# Patient Record
Sex: Female | Born: 1995 | Race: Black or African American | Hispanic: No | Marital: Single | Smoking: Current every day smoker
Health system: Southern US, Community
[De-identification: ages and names within clinical notes are randomized; demographics above are authoritative.]

## PROBLEM LIST (undated history)

## (undated) ENCOUNTER — Inpatient Hospital Stay (HOSPITAL_COMMUNITY): Payer: Self-pay

## (undated) DIAGNOSIS — F419 Anxiety disorder, unspecified: Secondary | ICD-10-CM

## (undated) DIAGNOSIS — R600 Localized edema: Secondary | ICD-10-CM

## (undated) DIAGNOSIS — L309 Dermatitis, unspecified: Secondary | ICD-10-CM

## (undated) DIAGNOSIS — N83209 Unspecified ovarian cyst, unspecified side: Secondary | ICD-10-CM

## (undated) DIAGNOSIS — M549 Dorsalgia, unspecified: Secondary | ICD-10-CM

## (undated) DIAGNOSIS — K589 Irritable bowel syndrome without diarrhea: Secondary | ICD-10-CM

## (undated) HISTORY — DX: Irritable bowel syndrome, unspecified: K58.9

## (undated) HISTORY — PX: NO PAST SURGERIES: SHX2092

## (undated) HISTORY — DX: Localized edema: R60.0

## (undated) HISTORY — DX: Anxiety disorder, unspecified: F41.9

## (undated) HISTORY — DX: Dorsalgia, unspecified: M54.9

---

## 2013-07-16 ENCOUNTER — Emergency Department (HOSPITAL_COMMUNITY): Payer: BC Managed Care – PPO

## 2013-07-16 ENCOUNTER — Emergency Department (HOSPITAL_COMMUNITY)
Admission: EM | Admit: 2013-07-16 | Discharge: 2013-07-16 | Disposition: A | Discharge status: Home / Self Care | Payer: BC Managed Care – PPO | Attending: Emergency Medicine | Admitting: Emergency Medicine

## 2013-07-16 ENCOUNTER — Encounter (HOSPITAL_COMMUNITY): Payer: Self-pay | Admitting: Emergency Medicine

## 2013-07-16 DIAGNOSIS — Z79899 Other long term (current) drug therapy: Secondary | ICD-10-CM | POA: Insufficient documentation

## 2013-07-16 DIAGNOSIS — F17200 Nicotine dependence, unspecified, uncomplicated: Secondary | ICD-10-CM | POA: Insufficient documentation

## 2013-07-16 DIAGNOSIS — N83209 Unspecified ovarian cyst, unspecified side: Secondary | ICD-10-CM | POA: Insufficient documentation

## 2013-07-16 DIAGNOSIS — Z3202 Encounter for pregnancy test, result negative: Secondary | ICD-10-CM | POA: Insufficient documentation

## 2013-07-16 LAB — CBC WITH DIFFERENTIAL/PLATELET
Basophils Absolute: 0 10*3/uL (ref 0.0–0.1)
Basophils Relative: 0 % (ref 0–1)
EOS ABS: 0.1 10*3/uL (ref 0.0–0.7)
EOS PCT: 1 % (ref 0–5)
HEMATOCRIT: 31.8 % — AB (ref 36.0–46.0)
Hemoglobin: 10.6 g/dL — ABNORMAL LOW (ref 12.0–15.0)
LYMPHS ABS: 2.8 10*3/uL (ref 0.7–4.0)
LYMPHS PCT: 25 % (ref 12–46)
MCH: 27.8 pg (ref 26.0–34.0)
MCHC: 33.3 g/dL (ref 30.0–36.0)
MCV: 83.5 fL (ref 78.0–100.0)
MONO ABS: 0.8 10*3/uL (ref 0.1–1.0)
Monocytes Relative: 7 % (ref 3–12)
Neutro Abs: 7.4 10*3/uL (ref 1.7–7.7)
Neutrophils Relative %: 67 % (ref 43–77)
PLATELETS: 312 10*3/uL (ref 150–400)
RBC: 3.81 MIL/uL — ABNORMAL LOW (ref 3.87–5.11)
RDW: 14.8 % (ref 11.5–15.5)
WBC: 11 10*3/uL — AB (ref 4.0–10.5)

## 2013-07-16 LAB — POCT PREGNANCY, URINE: Preg Test, Ur: NEGATIVE

## 2013-07-16 MED ORDER — SODIUM CHLORIDE 0.9 % IV BOLUS (SEPSIS)
1000.0000 mL | Freq: Once | INTRAVENOUS | Status: AC
  Administered 2013-07-16: 1000 mL via INTRAVENOUS

## 2013-07-16 NOTE — ED Provider Notes (Signed)
CSN: [id number]     Arrival date & time 07/16/13  0108 History   First MD Initiated Contact with Patient 07/16/13 0143     Chief Complaint  Patient presents with  . Vaginal Bleeding     (Consider location/radiation/quality/duration/timing/severity/associated sxs/prior Treatment) Patient is a 18 y.o. female presenting with vaginal bleeding. The history is provided by the patient.  Vaginal Bleeding Quality:  Bright red Severity:  Moderate Onset quality:  Sudden Duration:  1 day Timing:  Constant Progression:  Worsening Chronicity:  New Menstrual history:  Irregular Number of tampons used:  12 Possible pregnancy: yes   Context: at rest and spontaneously   Context: not after intercourse, not during intercourse and not foreign body   Relieved by:  None tried Worsened by:  Nothing tried Associated symptoms: no abdominal pain, no back pain, no dizziness, no dysuria, no fatigue, no nausea and no vaginal discharge   Risk factors: no bleeding disorder, no hx of endometriosis, no gynecological surgery and no ovarian cysts     History reviewed. No pertinent past medical history. History reviewed. No pertinent past surgical history. No family history on file. History  Substance Use Topics  . Smoking status: Current Every Day Smoker  . Smokeless tobacco: Not on file  . Alcohol Use: Yes   OB History   Grav Para Term Preterm Abortions TAB SAB Ect Mult Living                 Review of Systems  Constitutional: Negative for activity change and fatigue.  Respiratory: Negative for shortness of breath.   Cardiovascular: Negative for chest pain.  Gastrointestinal: Negative for nausea, vomiting and abdominal pain.  Genitourinary: Positive for vaginal bleeding. Negative for dysuria and vaginal discharge.  Musculoskeletal: Negative for back pain and neck pain.  Neurological: Negative for dizziness and headaches.  Hematological: Does not bruise/bleed easily.      Allergies  Review of  patient's allergies indicates no known allergies.  Home Medications   Current Outpatient Rx  Name  Route  Sig  Dispense  Refill  . BIOTIN PO   Oral   Take 1 tablet by mouth daily.         Marland Kitchen ibuprofen (ADVIL,MOTRIN) 200 MG tablet   Oral   Take 800 mg by mouth every 8 (eight) hours as needed for moderate pain.          BP 131/74  Pulse 79  Temp(Src) 97.8 F (36.6 C) (Oral)  Resp 18  Ht 5\' 1"  (1.549 m)  Wt 218 lb (98.884 kg)  BMI 41.21 kg/m2  SpO2 98%  LMP 06/11/2013 Physical Exam  Nursing note and vitals reviewed. Constitutional: She is oriented to person, place, and time. She appears well-developed and well-nourished.  HENT:  Head: Normocephalic and atraumatic.  Eyes: EOM are normal. Pupils are equal, round, and reactive to light.  Neck: Neck supple.  Cardiovascular: Normal rate, regular rhythm and normal heart sounds.   No murmur heard. Pulmonary/Chest: Effort normal. No respiratory distress.  Abdominal: Soft. She exhibits no distension. There is no tenderness. There is no rebound and no guarding.  Neurological: She is alert and oriented to person, place, and time.  Skin: Skin is warm and dry.    ED Course  Procedures (including critical care time) Labs Review Labs Reviewed  CBC WITH DIFFERENTIAL - Abnormal; Notable for the following:    WBC 11.0 (*)    RBC 3.81 (*)    Hemoglobin 10.6 (*)    HCT  31.8 (*)    All other components within normal limits  POCT PREGNANCY, URINE   Imaging Review [place] Transvaginal Non-ob  07/16/2013   CLINICAL DATA:  18 year old female with heavy vaginal bleeding. Passing clots. Initial encounter.  EXAM: TRANSABDOMINAL AND TRANSVAGINAL ULTRASOUND OF PELVIS  TECHNIQUE: Both transabdominal and transvaginal ultrasound examinations of the pelvis were performed. Transabdominal technique was performed for global imaging of the pelvis including uterus, ovaries, adnexal regions, and pelvic cul-de-sac. It was necessary to proceed with  endovaginal exam following the transabdominal exam to visualize the ovaries, endometrium.  COMPARISON:  None.  FINDINGS: Uterus  Measurements: 7.5 x 2.5 x 4.6 cm. No fibroids or other mass visualized.  Endometrium  Thickness: 5 mm.  No focal abnormality visualized.  Right ovary  Measurements: 4.8 x 2.3 x 3.3 cm. Multiple small follicles and what appears to be a collapsing cyst with some hemorrhage (image 43, 2.7 cm). No solid or vascular elements on Doppler interrogation (image 47).  Left ovary  Measurements: 2.6 x 1.0 x 2.5 cm. Small follicles. Normal appearance/no adnexal mass.  Other findings  Trace simple appearing free fluid.  IMPRESSION: Trace simple appearing free fluid and collapsing hemorrhagic cyst of the right ovary (2.7 cm). No followup indicated.  This recommendation follows the consensus statement: Management of Asymptomatic Ovarian and Other Adnexal Cysts Imaged at US: Society of Radiologists in Ultrasound Consensus Conference Statement. Radiology 2010; [phone].   Electronically Signed   By: Augusto GambleLee  Hall M.D.   On: 07/16/2013 04:07   [place] Pelvis Complete  07/16/2013   CLINICAL DATA:  18 year old female with heavy vaginal bleeding. Passing clots. Initial encounter.  EXAM: TRANSABDOMINAL AND TRANSVAGINAL ULTRASOUND OF PELVIS  TECHNIQUE: Both transabdominal and transvaginal ultrasound examinations of the pelvis were performed. Transabdominal technique was performed for global imaging of the pelvis including uterus, ovaries, adnexal regions, and pelvic cul-de-sac. It was necessary to proceed with endovaginal exam following the transabdominal exam to visualize the ovaries, endometrium.  COMPARISON:  None.  FINDINGS: Uterus  Measurements: 7.5 x 2.5 x 4.6 cm. No fibroids or other mass visualized.  Endometrium  Thickness: 5 mm.  No focal abnormality visualized.  Right ovary  Measurements: 4.8 x 2.3 x 3.3 cm. Multiple small follicles and what appears to be a collapsing cyst with some hemorrhage (image 43,  2.7 cm). No solid or vascular elements on Doppler interrogation (image 47).  Left ovary  Measurements: 2.6 x 1.0 x 2.5 cm. Small follicles. Normal appearance/no adnexal mass.  Other findings  Trace simple appearing free fluid.  IMPRESSION: Trace simple appearing free fluid and collapsing hemorrhagic cyst of the right ovary (2.7 cm). No followup indicated.  This recommendation follows the consensus statement: Management of Asymptomatic Ovarian and Other Adnexal Cysts Imaged at US: Society of Radiologists in Ultrasound Consensus Conference Statement. Radiology 2010; [phone] [id number].   Electronically Signed   By: Augusto GambleLee  Hall M.D.   On: 07/16/2013 04:07    EKG Interpretation   None       MDM   Final diagnoses:  Ovarian cyst  Hemorrhagic cyst of ovary    Pt  Comes in to the ER with cc of vaginal discharge. Pt seen by health dept, and got Gyne evaluation that was normal. She has been having bleeding all day today, with clots passage. Upreg is negative. No avd pain, no vaginald/c, no hx of same before. [place] pelvis shows hemorrhagic cyst. Will d.c with Gyne f.u - she has no private gynec.  Derwood KaplanAnkit Bocephus Cali, MD 07/16/13 [id number]

## 2013-07-16 NOTE — Discharge Instructions (Signed)
Your ultrasound shows bleeding ovarian cyst. Bleeding shoulder resolve on it's own, but if you don't improve - see the OB doctors as requested.   Ovarian Cyst An ovarian cyst is a fluid-filled sac that forms on an ovary. The ovaries are small organs that produce eggs in women. Various types of cysts can form on the ovaries. Most are not cancerous. Many do not cause problems, and they often go away on their own. Some may cause symptoms and require treatment. Common types of ovarian cysts include:  Functional cysts These cysts may occur every month during the menstrual cycle. This is normal. The cysts usually go away with the next menstrual cycle if the woman does not get pregnant. Usually, there are no symptoms with a functional cyst.  Endometrioma cysts These cysts form from the tissue that lines the uterus. They are also called "chocolate cysts" because they become filled with blood that turns brown. This type of cyst can cause pain in the lower abdomen during intercourse and with your menstrual period.  Cystadenoma cysts This type develops from the cells on the outside of the ovary. These cysts can get very big and cause lower abdomen pain and pain with intercourse. This type of cyst can twist on itself, cut off its blood supply, and cause severe pain. It can also easily rupture and cause a lot of pain.  Dermoid cysts This type of cyst is sometimes found in both ovaries. These cysts may contain different kinds of body tissue, such as skin, teeth, hair, or cartilage. They usually do not cause symptoms unless they get very big.  Theca lutein cysts These cysts occur when too much of a certain hormone (human chorionic gonadotropin) is produced and overstimulates the ovaries to produce an egg. This is most common after procedures used to assist with the conception of a baby (in vitro fertilization). CAUSES   Fertility drugs can cause a condition in which multiple large cysts are formed on the ovaries.  This is called ovarian hyperstimulation syndrome.  A condition called polycystic ovary syndrome can cause hormonal imbalances that can lead to nonfunctional ovarian cysts. SIGNS AND SYMPTOMS  Many ovarian cysts do not cause symptoms. If symptoms are present, they may include:  Pelvic pain or pressure.  Pain in the lower abdomen.  Pain during sexual intercourse.  Increasing girth (swelling) of the abdomen.  Abnormal menstrual periods.  Increasing pain with menstrual periods.  Stopping having menstrual periods without being pregnant. DIAGNOSIS  These cysts are commonly found during a routine or annual pelvic exam. Tests may be ordered to find out more about the cyst. These tests may include:  Ultrasound.  X-ray of the pelvis.  CT scan.  MRI.  Blood tests. TREATMENT  Many ovarian cysts go away on their own without treatment. Your health care provider may want to check your cyst regularly for 2 3 months to see if it changes. For women in menopause, it is particularly important to monitor a cyst closely because of the higher rate of ovarian cancer in menopausal women. When treatment is needed, it may include any of the following:  A procedure to drain the cyst (aspiration). This may be done using a long needle and ultrasound. It can also be done through a laparoscopic procedure. This involves using a thin, lighted tube with a tiny camera on the end (laparoscope) inserted through a small incision.  Surgery to remove the whole cyst. This may be done using laparoscopic surgery or an open surgery involving  a larger incision in the lower abdomen.  Hormone treatment or birth control pills. These methods are sometimes used to help dissolve a cyst. HOME CARE INSTRUCTIONS   Only take over-the-counter or prescription medicines as directed by your health care provider.  Follow up with your health care provider as directed.  Get regular pelvic exams and Pap tests. SEEK MEDICAL CARE IF:     Your periods are late, irregular, or painful, or they stop.  Your pelvic pain or abdominal pain does not go away.  Your abdomen becomes larger or swollen.  You have pressure on your bladder or trouble emptying your bladder completely.  You have pain during sexual intercourse.  You have feelings of fullness, pressure, or discomfort in your stomach.  You lose weight for no apparent reason.  You feel generally ill.  You become constipated.  You lose your appetite.  You develop acne.  You have an increase in body and facial hair.  You are gaining weight, without changing your exercise and eating habits.  You think you are pregnant. SEEK IMMEDIATE MEDICAL CARE IF:   You have increasing abdominal pain.  You feel sick to your stomach (nauseous), and you throw up (vomit).  You develop a fever that comes on suddenly.  You have abdominal pain during a bowel movement.  Your menstrual periods become heavier than usual. Document Released: 05/18/2005 Document Revised: 03/08/2013 Document Reviewed: 01/23/2013 [facility] Patient Information 2014 [place], [place].

## 2013-07-16 NOTE — ED Notes (Signed)
In dec she had this same type period.  No pain

## 2013-07-16 NOTE — ED Notes (Signed)
Heavy vaginal bleeding all day which is unusual for her.  lmp jan 11th .  This period started yesterday

## 2013-07-16 NOTE — ED Notes (Signed)
Patient states she has been vaginally bleeding heavily since yesterday.  States she has to change her tampon every 10-15 minutes.

## 2013-07-16 NOTE — ED Notes (Signed)
Continues to pass large clots.

## 2013-07-16 NOTE — ED Notes (Signed)
Patient returned from ultrasound.

## 2013-08-21 ENCOUNTER — Encounter: Payer: BC Managed Care – PPO | Admitting: Obstetrics and Gynecology

## 2013-08-31 ENCOUNTER — Inpatient Hospital Stay (HOSPITAL_COMMUNITY)
Admission: AD | Admit: 2013-08-31 | Discharge: 2013-08-31 | Disposition: A | Discharge status: Home / Self Care | Payer: BC Managed Care – PPO | Source: Ambulatory Visit | Attending: Obstetrics & Gynecology | Admitting: Obstetrics & Gynecology

## 2013-08-31 ENCOUNTER — Encounter (HOSPITAL_COMMUNITY): Payer: Self-pay | Admitting: *Deleted

## 2013-08-31 DIAGNOSIS — Z202 Contact with and (suspected) exposure to infections with a predominantly sexual mode of transmission: Secondary | ICD-10-CM

## 2013-08-31 DIAGNOSIS — L293 Anogenital pruritus, unspecified: Secondary | ICD-10-CM | POA: Insufficient documentation

## 2013-08-31 DIAGNOSIS — N898 Other specified noninflammatory disorders of vagina: Secondary | ICD-10-CM | POA: Insufficient documentation

## 2013-08-31 DIAGNOSIS — F17200 Nicotine dependence, unspecified, uncomplicated: Secondary | ICD-10-CM | POA: Insufficient documentation

## 2013-08-31 DIAGNOSIS — N83209 Unspecified ovarian cyst, unspecified side: Secondary | ICD-10-CM | POA: Insufficient documentation

## 2013-08-31 LAB — WET PREP, GENITAL
Clue Cells Wet Prep HPF POC: NONE SEEN
Trich, Wet Prep: NONE SEEN
WBC, Wet Prep HPF POC: NONE SEEN
Yeast Wet Prep HPF POC: NONE SEEN

## 2013-08-31 LAB — POCT PREGNANCY, URINE: Preg Test, Ur: NEGATIVE

## 2013-08-31 MED ORDER — IBUPROFEN 600 MG PO TABS: 600.0000 mg | ORAL_TABLET | Freq: Four times a day (QID) | ORAL | Status: DC | PRN

## 2013-08-31 MED ORDER — AZITHROMYCIN 250 MG PO TABS
1000.0000 mg | ORAL_TABLET | Freq: Once | ORAL | Status: AC
  Administered 2013-08-31: 1000 mg via ORAL
  Filled 2013-08-31: qty 4

## 2013-08-31 MED ORDER — CEFTRIAXONE SODIUM 250 MG IJ SOLR
250.0000 mg | Freq: Once | INTRAMUSCULAR | Status: AC
  Administered 2013-08-31: 250 mg via INTRAMUSCULAR
  Filled 2013-08-31: qty 250

## 2013-08-31 NOTE — MAU Note (Signed)
Pt states she has beening having milky white discharge and vaginal irritation, pt states she has also had unprotected sex and feels she might have an STD

## 2013-08-31 NOTE — MAU Note (Signed)
Symptoms like and STD. Has a white d/c.  Had it before, " knows her body"

## 2013-08-31 NOTE — MAU Provider Note (Signed)
History     CSN: [id number]  Arrival date and time: 08/31/13 1616   First Provider Initiated Contact with Patient 08/31/13 1741      Chief Complaint  Patient presents with  . Vaginal Discharge   Vaginal Discharge The patient's primary symptoms include a vaginal discharge. Pertinent negatives include no abdominal pain, chills, constipation, diarrhea, dysuria, fever, flank pain, frequency, headaches, hematuria, joint pain, nausea, rash, sore throat, urgency or vomiting.   Ms. Mackert is a 18 year old nulliparous female with a pmh of chlamydia s/p tx presenting for a 2 week history of progressively worsening vaginal discharge after having unprotected sex with a female. Patient reports that her symptoms are very similar to her previous STI and is concerned that she has chlamydia again. The discharge is without blood but she reports needing to change her underwear more frequently. She also admits mild itching but no rashes or lesions her vagina. Her lmp was 08/14/13 and was regular. She denies changes in urination, hematuria, abdominal and pelvic pain, fevers, N/V, diarrhea, constipation, joint pain.  Of note, in February she had an episode of heavy bleeding leading her to the ED. She was diagnosed with an ovarian cyst and has been scheduled for f/u on April 30th.   Past Medical History  Diagnosis Date  . Medical history non-contributory     Past Surgical History  Procedure Laterality Date  . No past surgeries      Family History  Problem Relation Age of Onset  . Diabetes Father   . Hypertension Father   . Kidney disease Father     History  Substance Use Topics  . Smoking status: Current Every Day Smoker  . Smokeless tobacco: Not on file  . Alcohol Use: Yes    Allergies: No Known Allergies  Prescriptions prior to admission  Medication Sig Dispense Refill  . BIOTIN PO Take 1 tablet by mouth daily.      Marland Kitchen ibuprofen (ADVIL,MOTRIN) 200 MG tablet Take 800 mg by mouth every 8  (eight) hours as needed for moderate pain.      Marland Kitchen triamcinolone cream (KENALOG) 0.1 % Apply 1 application topically 2 (two) times daily as needed (rash).        Review of Systems  Constitutional: Negative for fever and chills.  HENT: Negative for hearing loss and sore throat.   Eyes: Negative for blurred vision.  Respiratory: Negative for shortness of breath.   Cardiovascular: Negative for chest pain and leg swelling.  Gastrointestinal: Negative for nausea, vomiting, abdominal pain, diarrhea, constipation and blood in stool.  Genitourinary: Positive for vaginal discharge. Negative for dysuria, urgency, frequency, hematuria and flank pain.  Musculoskeletal: Negative for joint pain and myalgias.  Skin: Negative for itching and rash.  Neurological: Negative for dizziness and headaches.   Physical Exam   Blood pressure 127/56, pulse 94, temperature 98.4 F (36.9 C), temperature source Oral, resp. rate 18, height 5\' 1"  (1.549 m), weight 98.884 kg (218 lb), last menstrual period 08/14/2013.  Physical Exam  Constitutional: She is oriented to person, place, and time. She appears well-developed and well-nourished. No distress.  HENT:  Head: Normocephalic.  Eyes: EOM are normal. Pupils are equal, round, and reactive to light.  Neck: No tracheal deviation present.  Cardiovascular: Normal rate, regular rhythm, normal heart sounds and intact distal pulses.  Exam reveals no gallop and no friction rub.   No murmur heard. Respiratory: Effort normal and breath sounds normal. No stridor. No respiratory distress. She has no wheezes.  She has no rales. She exhibits no tenderness.  GI: Soft. Bowel sounds are normal. She exhibits no distension and no mass. There is no tenderness.  Genitourinary: There is breast tenderness (right sided adenexal tenderness) and discharge (thick white discharge). No breast swelling. There is no rash, tenderness, lesion or injury on the right labia. There is no rash, tenderness,  lesion or injury on the left labia. No tenderness or bleeding around the vagina. Vaginal discharge (thick white discharge) found.  Musculoskeletal: Normal range of motion. She exhibits no edema and no tenderness.  Neurological: She is alert and oriented to person, place, and time.  Skin: Skin is warm and dry. No rash noted. She is not diaphoretic.  Psychiatric: She has a normal mood and affect. Her behavior is normal. Thought content normal.   Results for orders placed during the hospital encounter of 08/31/13 (from the past 48 hour(s))  POCT PREGNANCY, URINE     Status: None   Collection Time    08/31/13  4:52 PM      Result Value Ref Range   Preg Test, Ur NEGATIVE  NEGATIVE   Comment:            THE SENSITIVITY OF THIS     METHODOLOGY IS >24 mIU/mL  WET PREP, GENITAL     Status: None   Collection Time    08/31/13  5:50 PM      Result Value Ref Range   Yeast Wet Prep HPF POC NONE SEEN  NONE SEEN   Trich, Wet Prep NONE SEEN  NONE SEEN   Clue Cells Wet Prep HPF POC NONE SEEN  NONE SEEN   WBC, Wet Prep HPF POC NONE SEEN  NONE SEEN   Comment: FEW BACTERIA SEEN    MAU Course  Procedures  MDM STI screen   Assessment and Plan  Ms. Lew DawesRitter is a 18 year old with possible STI. GC screen and wet prep obtained. Results pending. Patient treated empirically with Azithromycin and Ceftriaxone. Discussed birth control with patient as she endorsed not wanting to become pregnant. Also discussed importance of using protection to avoid STI's.  Wallis BambergMani, Mario 08/31/2013, 6:16 PM  Pt seen with Wallis BambergMario Mani, PA student and agree with assessment and management Pamelia HoitSusan Delyle Weider [facility]NP-BC

## 2013-09-01 LAB — GC/CHLAMYDIA PROBE AMP
CT Probe RNA: NEGATIVE
GC Probe RNA: NEGATIVE

## 2013-09-20 NOTE — MAU Provider Note (Signed)
Attestation of Attending Supervision of Advanced Practitioner (CNM/NP): Evaluation and management procedures were performed by the Advanced Practitioner under my supervision and collaboration.  I have reviewed the Advanced Practitioner's note and chart, and I agree with the management and plan.  Coston Mandato Harraway-Smith 1:55 PM

## 2013-09-28 ENCOUNTER — Ambulatory Visit (INDEPENDENT_AMBULATORY_CARE_PROVIDER_SITE_OTHER): Payer: BC Managed Care – PPO | Admitting: Obstetrics & Gynecology

## 2013-09-28 ENCOUNTER — Encounter: Payer: Self-pay | Admitting: Obstetrics & Gynecology

## 2013-09-28 ENCOUNTER — Telehealth: Payer: Self-pay | Admitting: *Deleted

## 2013-09-28 VITALS — BP 120/79 | HR 92 | Temp 98.1°F | Ht 61.0 in | Wt 214.8 lb

## 2013-09-28 DIAGNOSIS — Z113 Encounter for screening for infections with a predominantly sexual mode of transmission: Secondary | ICD-10-CM

## 2013-09-28 DIAGNOSIS — Z711 Person with feared health complaint in whom no diagnosis is made: Secondary | ICD-10-CM

## 2013-09-28 NOTE — Patient Instructions (Signed)
Sexually Transmitted Disease A sexually transmitted disease (STD) is a disease or infection that may be passed (transmitted) from person to person, usually during sexual activity. This may happen by way of saliva, semen, blood, vaginal mucus, or urine. Common STDs include:   Gonorrhea.   Chlamydia.   Syphilis.   HIV and AIDS.   Genital herpes.   Hepatitis B and C.   Trichomonas.   Human papillomavirus (HPV).   Pubic lice.   Scabies.  Mites.  Bacterial vaginosis. WHAT ARE CAUSES OF STDs? An STD may be caused by bacteria, a virus, or parasites. STDs are often transmitted during sexual activity if one person is infected. However, they may also be transmitted through nonsexual means. STDs may be transmitted after:   Sexual intercourse with an infected person.   Sharing sex toys with an infected person.   Sharing needles with an infected person or using unclean piercing or tattoo needles.  Having intimate contact with the genitals, mouth, or rectal areas of an infected person.   Exposure to infected fluids during birth. WHAT ARE THE SIGNS AND SYMPTOMS OF STDs? Different STDs have different symptoms. Some people may not have any symptoms. If symptoms are present, they may include:   Painful or bloody urination.   Pain in the pelvis, abdomen, vagina, anus, throat, or eyes.   Skin rash, itching, irritation, growths, sores (lesions), ulcerations, or warts in the genital or anal area.  Abnormal vaginal discharge with or without bad odor.   Penile discharge in men.   Fever.   Pain or bleeding during sexual intercourse.   Swollen glands in the groin area.   Yellow skin and eyes (jaundice). This is seen with hepatitis.   Swollen testicles.  Infertility.  Sores and blisters in the mouth. HOW ARE STDs DIAGNOSED? To make a diagnosis, your health care provider may:   Take a medical history.   Perform a physical exam.   Take a sample of any  discharge for examination.  Swab the throat, cervix, opening to the penis, rectum, or vagina for testing.  Test a sample of your first morning urine.   Perform blood tests.   Perform a Pap smear, if this applies.   Perform a colposcopy.   Perform a laparoscopy.  HOW ARE STDs TREATED? Treatment depends on the STD. Some STDs may be treated but not cured.   Chlamydia, gonorrhea, trichomonas, and syphilis can be cured with antibiotics.   Genital herpes, hepatitis, and HIV can be treated, but not cured, with prescribed medicines. The medicines lessen symptoms.   Genital warts from HPV can be treated with medicine or by freezing, burning (electrocautery), or surgery. Warts may come back.   HPV cannot be cured with medicine or surgery. However, abnormal areas may be removed from the cervix, vagina, or vulva.   If your diagnosis is confirmed, your recent sexual partners need treatment. This is true even if they are symptom-free or have a negative culture or evaluation. They should not have sex until their health care providers say it is OK. HOW CAN I REDUCE MY RISK OF GETTING AN STD?  Use latex condoms, dental dams, and water-soluble lubricants during sexual activity. Do not use petroleum jelly or oils.  Get vaccinated for HPV and hepatitis. If you have not received these vaccines in the past, talk to your health care provider about whether one or both might be right for you.   Avoid risky sex practices that can break the skin.  WHAT SHOULD   I DO IF I THINK I HAVE AN STD?  See your health care provider.   Inform all sexual partners. They should be tested and treated for any STDs.  Do not have sex until your health care provider says it is OK. WHEN SHOULD I GET HELP? Seek immediate medical care if:  You develop severe abdominal pain.  You are a man and notice swelling or pain in the testicles.  You are a woman and notice swelling or pain in your vagina. Document  Released: 08/08/2002 Document Revised: 03/08/2013 Document Reviewed: 12/06/2012 [employer] Patient Information 2014 [employer].  

## 2013-09-28 NOTE — Telephone Encounter (Signed)
Patient called requesting results of std tests. Informed patient that all results are negative. Patient had no further questions.

## 2013-09-28 NOTE — Progress Notes (Signed)
Patient ID: Rose Patterson, female   DOB: 1995-07-01, 18 y.o.   MRN: [id number]  Chief Complaint  Patient presents with  . Follow-up    HPI Rose Patterson is a 18 y.o. female.  G0P0 Patient's last menstrual period was 09/12/2013.  Was seen for heavy menses 07/2013 and hemorrhagic cyst was seen but o/w normal . Requests STD test today HPI  Past Medical History  Diagnosis Date  . Medical history non-contributory     Past Surgical History  Procedure Laterality Date  . No past surgeries      Family History  Problem Relation Age of Onset  . Diabetes Father   . Hypertension Father   . Kidney disease Father     Social History History  Substance Use Topics  . Smoking status: Current Every Day Smoker -- 0.50 packs/day    Types: Cigarettes  . Smokeless tobacco: Not on file  . Alcohol Use: 1.2 oz/week    1 Cans of beer, 1 Shots of liquor per week    No Known Allergies  Current Outpatient Prescriptions  Medication Sig Dispense Refill  . BIOTIN PO Take 1 tablet by mouth daily.      Marland Kitchen. ibuprofen (ADVIL,MOTRIN) 200 MG tablet Take 800 mg by mouth every 8 (eight) hours as needed for moderate pain.      Marland Kitchen. ibuprofen (ADVIL,MOTRIN) 600 MG tablet Take 1 tablet (600 mg total) by mouth every 6 (six) hours as needed.  30 tablet  0  . triamcinolone cream (KENALOG) 0.1 % Apply 1 application topically 2 (two) times daily as needed (rash).       No current facility-administered medications for this visit.    Review of Systems Review of Systems  Constitutional: Negative.   Genitourinary: Negative for vaginal bleeding, vaginal discharge, menstrual problem and pelvic pain.    Blood pressure 120/79, pulse 92, temperature 98.1 F (36.7 C), height 5\' 1"  (1.549 m), weight 214 lb 12.8 oz (97.433 kg), last menstrual period 09/12/2013.  Physical Exam Physical Exam  Constitutional: She appears well-developed. No distress.  Genitourinary: Vagina normal and uterus normal. No vaginal  discharge found.  No mass, STD probe done  Musculoskeletal: Normal range of motion.  Skin: Skin is warm and dry.  Psychiatric: She has a normal mood and affect. Her behavior is normal.    Data Reviewed CLINICAL DATA: 18 year old female with heavy vaginal bleeding.  Passing clots. Initial encounter.  EXAM:  TRANSABDOMINAL AND TRANSVAGINAL ULTRASOUND OF PELVIS  TECHNIQUE:  Both transabdominal and transvaginal ultrasound examinations of the  pelvis were performed. Transabdominal technique was performed for  global imaging of the pelvis including uterus, ovaries, adnexal  regions, and pelvic cul-de-sac. It was necessary to proceed with  endovaginal exam following the transabdominal exam to visualize the  ovaries, endometrium.  COMPARISON: None.  FINDINGS:  Uterus  Measurements: 7.5 x 2.5 x 4.6 cm. No fibroids or other mass  visualized.  Endometrium  Thickness: 5 mm. No focal abnormality visualized.  Right ovary  Measurements: 4.8 x 2.3 x 3.3 cm. Multiple small follicles and what  appears to be a collapsing cyst with some hemorrhage (image 43, 2.7  cm). No solid or vascular elements on Doppler interrogation (image  47).  Left ovary  Measurements: 2.6 x 1.0 x 2.5 cm. Small follicles. Normal  appearance/no adnexal mass.  Other findings  Trace simple appearing free fluid.  IMPRESSION:  Trace simple appearing free fluid and collapsing hemorrhagic cyst of  the right ovary (2.7 cm). No  followup indicated.  This recommendation follows the consensus statement: Management of  Asymptomatic Ovarian and Other Adnexal Cysts Imaged at US: Society  of Radiologists in Ultrasound Consensus Conference Statement.  Radiology 2010; [phone].  Electronically Signed  By: Augusto GambleLee Hall M.D.  On: 07/16/2013 04:07    Assessment    S/p hemorrhagic cyst, concern for STD     Plan    F/u STD test. Condoms and spermacide recommended.        Adam PhenixJames G Natayah Warmack 09/28/2013, 4:18 PM

## 2013-09-29 LAB — GC/CHLAMYDIA PROBE AMP
CT PROBE, AMP APTIMA: NEGATIVE
GC PROBE AMP APTIMA: NEGATIVE

## 2013-10-02 ENCOUNTER — Telehealth (HOSPITAL_COMMUNITY): Payer: Self-pay

## 2013-10-02 NOTE — Telephone Encounter (Signed)
Called patient to inform her that her GC/CT test results were negative.

## 2013-11-15 ENCOUNTER — Encounter: Payer: Self-pay | Admitting: *Deleted

## 2016-11-24 ENCOUNTER — Inpatient Hospital Stay (HOSPITAL_COMMUNITY): Payer: Medicaid Other

## 2016-11-24 ENCOUNTER — Encounter (HOSPITAL_COMMUNITY): Payer: Self-pay | Admitting: Student

## 2016-11-24 ENCOUNTER — Inpatient Hospital Stay (HOSPITAL_COMMUNITY)
Admission: AD | Admit: 2016-11-24 | Discharge: 2016-11-24 | Disposition: A | Discharge status: Home / Self Care | Payer: Medicaid Other | Source: Ambulatory Visit | Attending: Family Medicine | Admitting: Family Medicine

## 2016-11-24 DIAGNOSIS — Z825 Family history of asthma and other chronic lower respiratory diseases: Secondary | ICD-10-CM | POA: Diagnosis not present

## 2016-11-24 DIAGNOSIS — Z8249 Family history of ischemic heart disease and other diseases of the circulatory system: Secondary | ICD-10-CM | POA: Insufficient documentation

## 2016-11-24 DIAGNOSIS — O99331 Smoking (tobacco) complicating pregnancy, first trimester: Secondary | ICD-10-CM | POA: Diagnosis not present

## 2016-11-24 DIAGNOSIS — O4690 Antepartum hemorrhage, unspecified, unspecified trimester: Secondary | ICD-10-CM

## 2016-11-24 DIAGNOSIS — Z841 Family history of disorders of kidney and ureter: Secondary | ICD-10-CM | POA: Diagnosis not present

## 2016-11-24 DIAGNOSIS — O208 Other hemorrhage in early pregnancy: Secondary | ICD-10-CM | POA: Insufficient documentation

## 2016-11-24 DIAGNOSIS — F17210 Nicotine dependence, cigarettes, uncomplicated: Secondary | ICD-10-CM | POA: Insufficient documentation

## 2016-11-24 DIAGNOSIS — N939 Abnormal uterine and vaginal bleeding, unspecified: Secondary | ICD-10-CM | POA: Diagnosis present

## 2016-11-24 DIAGNOSIS — O468X1 Other antepartum hemorrhage, first trimester: Secondary | ICD-10-CM

## 2016-11-24 DIAGNOSIS — Z3401 Encounter for supervision of normal first pregnancy, first trimester: Secondary | ICD-10-CM

## 2016-11-24 DIAGNOSIS — O418X10 Other specified disorders of amniotic fluid and membranes, first trimester, not applicable or unspecified: Secondary | ICD-10-CM

## 2016-11-24 DIAGNOSIS — Z3A12 12 weeks gestation of pregnancy: Secondary | ICD-10-CM | POA: Insufficient documentation

## 2016-11-24 DIAGNOSIS — Z833 Family history of diabetes mellitus: Secondary | ICD-10-CM | POA: Diagnosis not present

## 2016-11-24 HISTORY — DX: Unspecified ovarian cyst, unspecified side: N83.209

## 2016-11-24 HISTORY — DX: Dermatitis, unspecified: L30.9

## 2016-11-24 LAB — CBC
HEMATOCRIT: 34.3 % — AB (ref 36.0–46.0)
HEMOGLOBIN: 11.4 g/dL — AB (ref 12.0–15.0)
MCH: 28.1 pg (ref 26.0–34.0)
MCHC: 33.2 g/dL (ref 30.0–36.0)
MCV: 84.5 fL (ref 78.0–100.0)
Platelets: 270 10*3/uL (ref 150–400)
RBC: 4.06 MIL/uL (ref 3.87–5.11)
RDW: 14.1 % (ref 11.5–15.5)
WBC: 10.7 10*3/uL — ABNORMAL HIGH (ref 4.0–10.5)

## 2016-11-24 LAB — URINALYSIS, MICROSCOPIC (REFLEX)

## 2016-11-24 LAB — HCG, QUANTITATIVE, PREGNANCY: hCG, Beta Chain, Quant, S: 61892 m[IU]/mL — ABNORMAL HIGH (ref <5)

## 2016-11-24 LAB — URINALYSIS, ROUTINE W REFLEX MICROSCOPIC
BILIRUBIN URINE: NEGATIVE
Glucose, UA: NEGATIVE mg/dL
Ketones, ur: NEGATIVE mg/dL
Leukocytes, UA: NEGATIVE
Nitrite: NEGATIVE
Protein, ur: NEGATIVE mg/dL
Specific Gravity, Urine: 1.02 (ref 1.005–1.030)
pH: 6 (ref 5.0–8.0)

## 2016-11-24 LAB — WET PREP, GENITAL
Clue Cells Wet Prep HPF POC: NONE SEEN
SPERM: NONE SEEN
Trich, Wet Prep: NONE SEEN
YEAST WET PREP: NONE SEEN

## 2016-11-24 LAB — ABO/RH: ABO/RH(D): O POS

## 2016-11-24 LAB — POCT PREGNANCY, URINE: Preg Test, Ur: POSITIVE — AB

## 2016-11-24 NOTE — MAU Provider Note (Signed)
History    CSN: [id number]  Arrival date and time: 11/24/16 4098   First Provider Initiated Contact with Patient 11/24/16 0957      Chief Complaint  Patient presents with  . Vaginal Bleeding   HPI Rose Patterson is a 21 y.o. G1P0 at [redacted]w[redacted]d who presents complaining of vaginal bleeding that started this morning. She reports a large gush of bright red bleeding this morning when she got up. It has slowed down since. She denies any pain. She reports some brown discharge since she found out she was pregnant. LMP 08/30/16. She denies any recent intercourse. She has not tried any treatments, nothing makes the bleeding better or worse. There are no associated symptoms.  OB History    Gravida Para Term Preterm AB Living   1             SAB TAB Ectopic Multiple Live Births                  Past Medical History:  Diagnosis Date  . Eczema   . Medical history non-contributory   . Ovarian cyst     Past Surgical History:  Procedure Laterality Date  . NO PAST SURGERIES      Family History  Problem Relation Age of Onset  . Diabetes Father   . Hypertension Father   . Kidney disease Father   . Asthma Father   . Cancer Paternal Grandmother        colon    Social History  Substance Use Topics  . Smoking status: Current Every Day Smoker    Packs/day: 0.25    Years: 5.00    Types: Cigarettes  . Smokeless tobacco: Never Used     Comment: was a pk a day  . Alcohol use No    Allergies: No Known Allergies  No prescriptions prior to admission.    Review of Systems  Constitutional: Negative.   Respiratory: Negative.  Negative for shortness of breath.   Cardiovascular: Negative for chest pain.  Gastrointestinal: Negative.  Negative for abdominal pain.  Genitourinary: Positive for vaginal bleeding and vaginal discharge. Negative for dysuria, frequency and urgency.  Neurological: Negative.  Negative for light-headedness and headaches.   Physical Exam   Blood pressure (!)  112/53, pulse 85, temperature 98.7 F (37.1 C), temperature source Oral, resp. rate 18, height 5\' 1"  (1.549 m), weight 187 lb (84.8 kg), last menstrual period 08/30/2016.  Physical Exam  Nursing note and vitals reviewed. Constitutional: She is oriented to person, place, and time. She appears well-developed and well-nourished.  HENT:  Head: Normocephalic and atraumatic.  Eyes: Conjunctivae are normal. No scleral icterus.  Cardiovascular: Normal rate, regular rhythm and normal heart sounds.   Respiratory: Effort normal and breath sounds normal. No respiratory distress.  GI: Soft. She exhibits no distension. There is no tenderness.  Genitourinary: There is bleeding in the vagina.  Genitourinary Comments: Small amount of dark red blood around cervix with one small dark clot. Os appears to be open  Neurological: She is alert and oriented to person, place, and time.  Skin: Skin is warm and dry.  Psychiatric: She has a normal mood and affect. Her behavior is normal. Judgment and thought content normal.    MAU Course  Procedures Results for orders placed or performed during the hospital encounter of 11/24/16 (from the past 24 hour(s))  Urinalysis, Routine w reflex microscopic     Status: Abnormal   Collection Time: 11/24/16  9:40 AM  Result  Value Ref Range   Color, Urine YELLOW YELLOW   APPearance CLEAR CLEAR   Specific Gravity, Urine 1.020 1.005 - 1.030   pH 6.0 5.0 - 8.0   Glucose, UA NEGATIVE NEGATIVE mg/dL   Hgb urine dipstick LARGE (A) NEGATIVE   Bilirubin Urine NEGATIVE NEGATIVE   Ketones, ur NEGATIVE NEGATIVE mg/dL   Protein, ur NEGATIVE NEGATIVE mg/dL   Nitrite NEGATIVE NEGATIVE   Leukocytes, UA NEGATIVE NEGATIVE  Urinalysis, Microscopic (reflex)     Status: Abnormal   Collection Time: 11/24/16  9:40 AM  Result Value Ref Range   RBC / HPF 6-30 0 - 5 RBC/hpf   WBC, UA 0-5 0 - 5 WBC/hpf   Bacteria, UA RARE (A) NONE SEEN   Squamous Epithelial / LPF 0-5 (A) NONE SEEN   Pregnancy, urine POC     Status: Abnormal   Collection Time: 11/24/16  9:56 AM  Result Value Ref Range   Preg Test, Ur POSITIVE (A) NEGATIVE  Wet prep, genital     Status: Abnormal   Collection Time: 11/24/16 10:09 AM  Result Value Ref Range   Yeast Wet Prep HPF POC NONE SEEN NONE SEEN   Trich, Wet Prep NONE SEEN NONE SEEN   Clue Cells Wet Prep HPF POC NONE SEEN NONE SEEN   WBC, Wet Prep HPF POC FEW (A) NONE SEEN   Sperm NONE SEEN   CBC     Status: Abnormal   Collection Time: 11/24/16 10:45 AM  Result Value Ref Range   WBC 10.7 (H) 4.0 - 10.5 K/uL   RBC 4.06 3.87 - 5.11 MIL/uL   Hemoglobin 11.4 (L) 12.0 - 15.0 g/dL   HCT 16.1 (L) 09.6 - 04.5 %   MCV 84.5 78.0 - 100.0 fL   MCH 28.1 26.0 - 34.0 pg   MCHC 33.2 30.0 - 36.0 g/dL   RDW 40.9 81.1 - 91.4 %   Platelets 270 150 - 400 K/uL  ABO/Rh     Status: None (Preliminary result)   Collection Time: 11/24/16 10:46 AM  Result Value Ref Range   ABO/RH(D) O POS    US Ob Comp Less 14 Wks  Result Date: 11/24/2016 CLINICAL DATA:  Vaginal bleeding EXAM: OBSTETRIC <14 WK ULTRASOUND TECHNIQUE: Transabdominal ultrasound was performed for evaluation of the gestation as well as the maternal uterus and adnexal regions. COMPARISON:  None. FINDINGS: Intrauterine gestational sac: Single Yolk sac:  Visualized Embryo:  Visualized Cardiac Activity: Visualize Heart Rate: 159 bpm MSD:   mm    w     d CRL:   53.6  mm   12 w 0 d                  [place] EDC: 06/08/2017 Subchorionic hemorrhage:  Small subchorionic hemorrhage Maternal uterus/adnexae: No adnexal mass or free fluid. IMPRESSION: Twelve week pregnancy with fetal heart rate 159 beats per minute. Small subchorionic hemorrhage. Electronically Signed   By: Charlett Nose M.D.   On: 11/24/2016 10:57    MDM UA, UPT CBC, quant, ABO/Rh, HIV antibody Wet prep and gc/chlamydia US OB transvaginal US OB Comp Less 14 wks US shows a 12 week IUP with a heart rate of 159bpm and a small subchorionic hemorrhage   Assessment and Plan   1. Vaginal bleeding in pregnancy   2. Encounter for supervision of normal first pregnancy in first trimester   3. Subchorionic hemorrhage of placenta in first trimester, single or unspecified fetus    -Discharge patient home in  stable condition -Discussed subchorionic hemorrhage with patient and bleeding precautions reviewed. -Encouraged patient to keep appointment to start prenatal care.. -Encouraged to return here or to other Urgent Care/ED if she develops worsening of symptoms, increase in pain, fever, or other concerning symptoms.   Cleone SlimCaroline Neill SNM 11/24/2016, 11:33 AM   Follow-up Information    [facility].   Contact information: [address]  [address] [place] [address] [phone]          I have seen this patient, performed the physical exam, and agree with the above nurse-midwife's note.  LEFTWICH-KIRBY, Macklyn Glandon Certified Nurse-Midwife

## 2016-11-24 NOTE — MAU Note (Signed)
Pt believes she is 12wks preg.  Started bleeding this morning.  Denies any pain

## 2016-11-24 NOTE — Discharge Instructions (Signed)
First Trimester of Pregnancy °The first trimester of pregnancy is from week 1 until the end of week 13 (months 1 through 3). A week after a sperm fertilizes an egg, the egg will implant on the wall of the uterus. This embryo will begin to develop into a baby. Genes from you and your partner will form the baby. The female genes will determine whether the baby will be a boy or a girl. At 6-8 weeks, the eyes and face will be formed, and the heartbeat can be seen on ultrasound. At the end of 12 weeks, all the baby's organs will be formed. °Now that you are pregnant, you will want to do everything you can to have a healthy baby. Two of the most important things are to get good prenatal care and to follow your health care provider's instructions. Prenatal care is all the medical care you receive before the baby's birth. This care will help prevent, find, and treat any problems during the pregnancy and childbirth. °Body changes during your first trimester °Your body goes through many changes during pregnancy. The changes vary from woman to woman. °· You may gain or lose a couple of pounds at first. °· You may feel sick to your stomach (nauseous) and you may throw up (vomit). If the vomiting is uncontrollable, call your health care provider. °· You may tire easily. °· You may develop headaches that can be relieved by medicines. All medicines should be approved by your health care provider. °· You may urinate more often. Painful urination may mean you have a bladder infection. °· You may develop heartburn as a result of your pregnancy. °· You may develop constipation because certain hormones are causing the muscles that push stool through your intestines to slow down. °· You may develop hemorrhoids or swollen veins (varicose veins). °· Your breasts may begin to grow larger and become tender. Your nipples may stick out more, and the tissue that surrounds them (areola) may become darker. °· Your gums may bleed and may be  sensitive to brushing and flossing. °· Dark spots or blotches (chloasma, mask of pregnancy) may develop on your face. This will likely fade after the baby is born. °· Your menstrual periods will stop. °· You may have a loss of appetite. °· You may develop cravings for certain kinds of food. °· You may have changes in your emotions from day to day, such as being excited to be pregnant or being concerned that something may go wrong with the pregnancy and baby. °· You may have more vivid and strange dreams. °· You may have changes in your hair. These can include thickening of your hair, rapid growth, and changes in texture. Some women also have hair loss during or after pregnancy, or hair that feels dry or thin. Your hair will most likely return to normal after your baby is born. ° °What to expect at prenatal visits °During a routine prenatal visit: °· You will be weighed to make sure you and the baby are growing normally. °· Your blood pressure will be taken. °· Your abdomen will be measured to track your baby's growth. °· The fetal heartbeat will be listened to between weeks 10 and 14 of your pregnancy. °· Test results from any previous visits will be discussed. ° °Your health care provider may ask you: °· How you are feeling. °· If you are feeling the baby move. °· If you have had any abnormal symptoms, such as leaking fluid, bleeding, severe headaches,   or abdominal cramping. °· If you are using any tobacco products, including cigarettes, chewing tobacco, and electronic cigarettes. °· If you have any questions. ° °Other tests that may be performed during your first trimester include: °· Blood tests to find your blood type and to check for the presence of any previous infections. The tests will also be used to check for low iron levels (anemia) and protein on red blood cells (Rh antibodies). Depending on your risk factors, or if you previously had diabetes during pregnancy, you may have tests to check for high blood  sugar that affects pregnant women (gestational diabetes). °· Urine tests to check for infections, diabetes, or protein in the urine. °· An ultrasound to confirm the proper growth and development of the baby. °· Fetal screens for spinal cord problems (spina bifida) and Down syndrome. °· HIV (human immunodeficiency virus) testing. Routine prenatal testing includes screening for HIV, unless you choose not to have this test. °· You may need other tests to make sure you and the baby are doing well. ° °Follow these instructions at home: °Medicines °· Follow your health care provider's instructions regarding medicine use. Specific medicines may be either safe or unsafe to take during pregnancy. °· Take a prenatal vitamin that contains at least 600 micrograms (mcg) of folic acid. °· If you develop constipation, try taking a stool softener if your health care provider approves. °Eating and drinking °· Eat a balanced diet that includes fresh fruits and vegetables, whole grains, good sources of protein such as meat, eggs, or tofu, and low-fat dairy. Your health care provider will help you determine the amount of weight gain that is right for you. °· Avoid raw meat and uncooked cheese. These carry germs that can cause birth defects in the baby. °· Eating four or five small meals rather than three large meals a day may help relieve nausea and vomiting. If you start to feel nauseous, eating a few soda crackers can be helpful. Drinking liquids between meals, instead of during meals, also seems to help ease nausea and vomiting. °· Limit foods that are high in fat and processed sugars, such as fried and sweet foods. °· To prevent constipation: °? Eat foods that are high in fiber, such as fresh fruits and vegetables, whole grains, and beans. °? Drink enough fluid to keep your urine clear or pale yellow. °Activity °· Exercise only as directed by your health care provider. Most women can continue their usual exercise routine during  pregnancy. Try to exercise for 30 minutes at least 5 days a week. Exercising will help you: °? Control your weight. °? Stay in shape. °? Be prepared for labor and delivery. °· Experiencing pain or cramping in the lower abdomen or lower back is a good sign that you should stop exercising. Check with your health care provider before continuing with normal exercises. °· Try to avoid standing for long periods of time. Move your legs often if you must stand in one place for a long time. °· Avoid heavy lifting. °· Wear low-heeled shoes and practice good posture. °· You may continue to have sex unless your health care provider tells you not to. °Relieving pain and discomfort °· Wear a good support bra to relieve breast tenderness. °· Take warm sitz baths to soothe any pain or discomfort caused by hemorrhoids. Use hemorrhoid cream if your health care provider approves. °· Rest with your legs elevated if you have leg cramps or low back pain. °· If you develop   varicose veins in your legs, wear support hose. Elevate your feet for 15 minutes, 3-4 times a day. Limit salt in your diet. °Prenatal care °· Schedule your prenatal visits by the twelfth week of pregnancy. They are usually scheduled monthly at first, then more often in the last 2 months before delivery. °· Write down your questions. Take them to your prenatal visits. °· Keep all your prenatal visits as told by your health care provider. This is important. °Safety °· Wear your seat belt at all times when driving. °· Make a list of emergency phone numbers, including numbers for family, friends, the hospital, and police and fire departments. °General instructions °· Ask your health care provider for a referral to a local prenatal education class. Begin classes no later than the beginning of month 6 of your pregnancy. °· Ask for help if you have counseling or nutritional needs during pregnancy. Your health care provider can offer advice or refer you to specialists for help  with various needs. °· Do not use hot tubs, steam rooms, or saunas. °· Do not douche or use tampons or scented sanitary pads. °· Do not cross your legs for long periods of time. °· Avoid cat litter boxes and soil used by cats. These carry germs that can cause birth defects in the baby and possibly loss of the fetus by miscarriage or stillbirth. °· Avoid all smoking, herbs, alcohol, and medicines not prescribed by your health care provider. Chemicals in these products affect the formation and growth of the baby. °· Do not use any products that contain nicotine or tobacco, such as cigarettes and e-cigarettes. If you need help quitting, ask your health care provider. You may receive counseling support and other resources to help you quit. °· Schedule a dentist appointment. At home, brush your teeth with a soft toothbrush and be gentle when you floss. °Contact a health care provider if: °· You have dizziness. °· You have mild pelvic cramps, pelvic pressure, or nagging pain in the abdominal area. °· You have persistent nausea, vomiting, or diarrhea. °· You have a bad smelling vaginal discharge. °· You have pain when you urinate. °· You notice increased swelling in your face, hands, legs, or ankles. °· You are exposed to fifth disease or chickenpox. °· You are exposed to German measles (rubella) and have never had it. °Get help right away if: °· You have a fever. °· You are leaking fluid from your vagina. °· You have spotting or bleeding from your vagina. °· You have severe abdominal cramping or pain. °· You have rapid weight gain or loss. °· You vomit blood or material that looks like coffee grounds. °· You develop a severe headache. °· You have shortness of breath. °· You have any kind of trauma, such as from a fall or a car accident. °Summary °· The first trimester of pregnancy is from week 1 until the end of week 13 (months 1 through 3). °· Your body goes through many changes during pregnancy. The changes vary from  woman to woman. °· You will have routine prenatal visits. During those visits, your health care provider will examine you, discuss any test results you may have, and talk with you about how you are feeling. °This information is not intended to replace advice given to you by your health care provider. Make sure you discuss any questions you have with your health care provider. °Document Released: 05/12/2001 Document Revised: 04/29/2016 Document Reviewed: 04/29/2016 °Elsevier Interactive Patient Education © 2017 [employer]   Inc[employer] °Subchorionic Hematoma °A subchorionic hematoma is a gathering of blood between the outer wall of the placenta and the inner wall of the womb (uterus). The placenta is the organ that connects the fetus to the wall of the uterus. The placenta performs the feeding, breathing (oxygen to the fetus), and waste removal (excretory work) of the fetus. °Subchorionic hematoma is the most common abnormality found on a result from ultrasonography done during the first trimester or early second trimester of pregnancy. If there has been little or no vaginal bleeding, early small hematomas usually shrink on their own and do not affect your baby or pregnancy. The blood is gradually absorbed over 1-2 weeks. When bleeding starts later in pregnancy or the hematoma is larger or occurs in an older pregnant woman, the outcome may not be as good. Larger hematomas may get bigger, which increases the chances for miscarriage. Subchorionic hematoma also increases the risk of premature detachment of the placenta from the uterus, preterm (premature) labor, and stillbirth. °Follow these instructions at home: °· Stay on bed rest if your health care provider recommends this. Although bed rest will not prevent more bleeding or prevent a miscarriage, your health care provider may recommend bed rest until you are advised otherwise. °· Avoid heavy lifting (more than 10 lb [4.5 kg]), exercise, sexual intercourse, or douching as  directed by your health care provider. °· Keep track of the number of pads you use each day and how soaked (saturated) they are. Write down this information. °· Do not use tampons. °· Keep all follow-up appointments as directed by your health care provider. Your health care provider may ask you to have follow-up blood tests or ultrasound tests or both. °Get help right away if: °· You have severe cramps in your stomach, back, abdomen, or pelvis. °· You have a fever. °· You pass large clots or tissue. Save any tissue for your health care provider to look at. °· Your bleeding increases or you become lightheaded, feel weak, or have fainting episodes. °This information is not intended to replace advice given to you by your health care provider. Make sure you discuss any questions you have with your health care provider. °Document Released: 09/02/2006 Document Revised: 10/24/2015 Document Reviewed: 12/15/2012 °Elsevier Interactive Patient Education © 2017 [employer] Inc. ° °

## 2016-11-24 NOTE — MAU Note (Signed)
preg confirmed at health Dept in May.  Has appt for this Thur.  When getting ready for work, went to the restroom, bleeding became more like a flow at that time. Denies pain

## 2016-11-25 LAB — GC/CHLAMYDIA PROBE AMP (~~LOC~~) NOT AT ARMC
CHLAMYDIA, DNA PROBE: NEGATIVE
NEISSERIA GONORRHEA: NEGATIVE

## 2016-11-25 LAB — HIV ANTIBODY (ROUTINE TESTING W REFLEX): HIV Screen 4th Generation wRfx: NONREACTIVE

## 2016-11-26 DIAGNOSIS — Z720 Tobacco use: Secondary | ICD-10-CM | POA: Insufficient documentation

## 2017-03-27 ENCOUNTER — Inpatient Hospital Stay (HOSPITAL_COMMUNITY)
Admission: AD | Admit: 2017-03-27 | Discharge: 2017-03-27 | Disposition: A | Discharge status: Home / Self Care | Payer: Medicaid Other | Source: Ambulatory Visit | Attending: Obstetrics and Gynecology | Admitting: Obstetrics and Gynecology

## 2017-03-27 ENCOUNTER — Encounter (HOSPITAL_COMMUNITY): Payer: Self-pay | Admitting: Student

## 2017-03-27 DIAGNOSIS — Z3A29 29 weeks gestation of pregnancy: Secondary | ICD-10-CM | POA: Insufficient documentation

## 2017-03-27 DIAGNOSIS — O9A213 Injury, poisoning and certain other consequences of external causes complicating pregnancy, third trimester: Secondary | ICD-10-CM | POA: Diagnosis not present

## 2017-03-27 DIAGNOSIS — O99333 Smoking (tobacco) complicating pregnancy, third trimester: Secondary | ICD-10-CM | POA: Insufficient documentation

## 2017-03-27 DIAGNOSIS — Z3689 Encounter for other specified antenatal screening: Secondary | ICD-10-CM

## 2017-03-27 DIAGNOSIS — F17210 Nicotine dependence, cigarettes, uncomplicated: Secondary | ICD-10-CM | POA: Insufficient documentation

## 2017-03-27 DIAGNOSIS — T1490XA Injury, unspecified, initial encounter: Secondary | ICD-10-CM

## 2017-03-27 DIAGNOSIS — Y92410 Unspecified street and highway as the place of occurrence of the external cause: Secondary | ICD-10-CM | POA: Insufficient documentation

## 2017-03-27 DIAGNOSIS — Z041 Encounter for examination and observation following transport accident: Secondary | ICD-10-CM | POA: Diagnosis not present

## 2017-03-27 LAB — URINALYSIS, ROUTINE W REFLEX MICROSCOPIC
Bilirubin Urine: NEGATIVE
Glucose, UA: NEGATIVE mg/dL
Hgb urine dipstick: NEGATIVE
KETONES UR: NEGATIVE mg/dL
Leukocytes, UA: NEGATIVE
NITRITE: NEGATIVE
PH: 7 (ref 5.0–8.0)
PROTEIN: NEGATIVE mg/dL
Specific Gravity, Urine: 1.013 (ref 1.005–1.030)
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
## 2017-04-01 ENCOUNTER — Encounter: Payer: Self-pay | Admitting: Skilled Nursing Facility1

## 2017-04-01 ENCOUNTER — Encounter: Payer: Medicaid Other | Attending: Obstetrics and Gynecology | Admitting: Skilled Nursing Facility1

## 2017-04-01 DIAGNOSIS — Z3A00 Weeks of gestation of pregnancy not specified: Secondary | ICD-10-CM | POA: Insufficient documentation

## 2017-04-01 DIAGNOSIS — O99810 Abnormal glucose complicating pregnancy: Secondary | ICD-10-CM | POA: Diagnosis present

## 2017-04-01 DIAGNOSIS — Z713 Dietary counseling and surveillance: Secondary | ICD-10-CM | POA: Insufficient documentation

## 2017-04-01 NOTE — Progress Notes (Signed)
Diabetes Self-Management Education  Visit Type: First/Initial  04/01/2017  [name], identified by name and date of birth, is a 21 y.o. female with a diagnosis of Diabetes: Gestational Diabetes.   ASSESSMENT  Height 5\' 1"  (1.549 m), weight 221 lb (100.2 kg), last menstrual period 08/30/2016. Body mass index is 41.76 kg/m.  Pt states she is [redacted] weeks along stating this is her first pregnancy. 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 Fasting 100 Pt was given accu-chek guide lot: [id number] Exp: 06/16/17      Diabetes Self-Management Education - 04/01/17 0816      Visit Information   Visit Type First/Initial     Initial Visit   Diabetes Type Gestational Diabetes   Are you currently following a meal plan? No   Are you taking your medications as prescribed? Not on Medications     Health Coping   How would you rate your overall health? Fair     Psychosocial Assessment   Patient Belief/Attitude about Diabetes Afraid     Pre-Education Assessment   Patient understands the diabetes disease and treatment process. Needs Instruction   Patient understands incorporating nutritional management into lifestyle. Needs Instruction   Patient undertands incorporating physical activity into lifestyle. Needs Instruction   Patient understands using medications safely. Needs Instruction   Patient understands monitoring blood glucose, interpreting and using results Needs Instruction   Patient understands prevention, detection, and treatment of acute complications. Needs Instruction   Patient understands prevention, detection, and treatment of chronic complications. Needs Instruction   Patient understands how to develop strategies to address psychosocial issues. Needs Instruction   Patient understands how to develop strategies to promote health/change behavior. Needs Instruction     Complications   How often do you check your blood sugar? 0 times/day (not testing)   Have you had a  dilated eye exam in the past 12 months? No   Have you had a dental exam in the past 12 months? No   Are you checking your feet? No     Dietary Intake   Breakfast fast food breakfast sandwich   Lunch fast food: salad or chicken sandwich or chinese food   Snack (afternoon) desserts   Dinner skipped   Snack (evening) desserts    Beverage(s) sweet tea, water, diet cranberry juice     Exercise   Exercise Type ADL's     Patient Education   Previous Diabetes Education No   Disease state  Factors that contribute to the development of diabetes   Nutrition management  Role of diet in the treatment of diabetes and the relationship between the three main macronutrients and blood glucose level;Food label reading, portion sizes and measuring food.;Carbohydrate counting;Reviewed blood glucose goals for pre and post meals and how to evaluate the patients' food intake on their blood glucose level.;Information on hints to eating out and maintain blood glucose control.   Physical activity and exercise  Role of exercise on diabetes management, blood pressure control and cardiac health.;Identified with patient nutritional and/or medication changes necessary with exercise.   Monitoring Taught/evaluated SMBG meter.;Purpose and frequency of SMBG.   Acute complications Taught treatment of hypoglycemia - the 15 rule.   Chronic complications Relationship between chronic complications and blood glucose control   Psychosocial adjustment Worked with patient to identify barriers to care and solutions;Role of stress on diabetes   Preconception care Reviewed with patient blood glucose goals with pregnancy     Individualized Goals (developed by patient)  Nutrition Follow meal plan discussed;General guidelines for healthy choices and portions discussed;Adjust meds/carbs with exercise as discussed   Physical Activity Exercise 5-7 days per week;30 minutes per day   Medications Not Applicable   Monitoring  test my blood  glucose as discussed;test blood glucose pre and post meals as discussed     Post-Education Assessment   Patient understands the diabetes disease and treatment process. Demonstrates understanding / competency   Patient understands incorporating nutritional management into lifestyle. Demonstrates understanding / competency   Patient undertands incorporating physical activity into lifestyle. Demonstrates understanding / competency   Patient understands using medications safely. Demonstrates understanding / competency   Patient understands monitoring blood glucose, interpreting and using results Demonstrates understanding / competency   Patient understands prevention, detection, and treatment of acute complications. Demonstrates understanding / competency   Patient understands prevention, detection, and treatment of chronic complications. Demonstrates understanding / competency   Patient understands how to develop strategies to address psychosocial issues. Demonstrates understanding / competency   Patient understands how to develop strategies to promote health/change behavior. Demonstrates understanding / competency     Outcomes   Expected Outcomes Demonstrated interest in learning. Expect positive outcomes   Future DMSE PRN   Program Status Completed      Individualized Plan for Diabetes Self-Management Training:   Learning Objective:  Patient will have a greater understanding of diabetes self-management. Patient education plan is to attend individual and/or group sessions per assessed needs and concerns.   Plan:   There are no Patient Instructions on file for this visit.  Expected Outcomes:  Demonstrated interest in learning. Expect positive outcomes  Education material provided: Meal plan card, My Plate and Snack sheet  If problems or questions, patient to contact team via:  Phone  Future DSME appointment: PRN

## 2017-06-01 ENCOUNTER — Encounter (HOSPITAL_COMMUNITY): Payer: Self-pay | Admitting: *Deleted

## 2017-06-01 ENCOUNTER — Inpatient Hospital Stay (HOSPITAL_COMMUNITY): Payer: Medicaid Other | Admitting: Anesthesiology

## 2017-06-01 ENCOUNTER — Inpatient Hospital Stay (HOSPITAL_COMMUNITY)
Admission: AD | Admit: 2017-06-01 | Discharge: 2017-06-03 | DRG: 807 | Disposition: A | Discharge status: Home / Self Care | Payer: Medicaid Other | Source: Ambulatory Visit | Attending: Obstetrics and Gynecology | Admitting: Obstetrics and Gynecology

## 2017-06-01 DIAGNOSIS — O24425 Gestational diabetes mellitus in childbirth, controlled by oral hypoglycemic drugs: Secondary | ICD-10-CM | POA: Diagnosis present

## 2017-06-01 DIAGNOSIS — Z3483 Encounter for supervision of other normal pregnancy, third trimester: Secondary | ICD-10-CM | POA: Diagnosis present

## 2017-06-01 DIAGNOSIS — O99334 Smoking (tobacco) complicating childbirth: Secondary | ICD-10-CM | POA: Diagnosis present

## 2017-06-01 DIAGNOSIS — O99214 Obesity complicating childbirth: Secondary | ICD-10-CM | POA: Diagnosis present

## 2017-06-01 DIAGNOSIS — O24419 Gestational diabetes mellitus in pregnancy, unspecified control: Secondary | ICD-10-CM | POA: Insufficient documentation

## 2017-06-01 DIAGNOSIS — O99824 Streptococcus B carrier state complicating childbirth: Secondary | ICD-10-CM | POA: Diagnosis present

## 2017-06-01 DIAGNOSIS — D649 Anemia, unspecified: Secondary | ICD-10-CM | POA: Diagnosis present

## 2017-06-01 DIAGNOSIS — F17210 Nicotine dependence, cigarettes, uncomplicated: Secondary | ICD-10-CM | POA: Diagnosis present

## 2017-06-01 DIAGNOSIS — O9902 Anemia complicating childbirth: Secondary | ICD-10-CM | POA: Diagnosis present

## 2017-06-01 DIAGNOSIS — Z3A39 39 weeks gestation of pregnancy: Secondary | ICD-10-CM

## 2017-06-01 LAB — GLUCOSE, CAPILLARY
GLUCOSE-CAPILLARY: 153 mg/dL — AB (ref 65–99)
GLUCOSE-CAPILLARY: 121 mg/dL — AB (ref 65–99)
GLUCOSE-CAPILLARY: 96 mg/dL (ref 65–99)
Glucose-Capillary: 133 mg/dL — ABNORMAL HIGH (ref 65–99)
Glucose-Capillary: 111 mg/dL — ABNORMAL HIGH (ref 65–99)
Glucose-Capillary: 94 mg/dL (ref 65–99)

## 2017-06-01 LAB — TYPE AND SCREEN
ABO/RH(D): O POS
ANTIBODY SCREEN: NEGATIVE

## 2017-06-01 LAB — CBC
HCT: 33.9 % — ABNORMAL LOW (ref 36.0–46.0)
Hemoglobin: 11.5 g/dL — ABNORMAL LOW (ref 12.0–15.0)
MCH: 27.9 pg (ref 26.0–34.0)
MCHC: 33.9 g/dL (ref 30.0–36.0)
MCV: 82.3 fL (ref 78.0–100.0)
PLATELETS: 223 10*3/uL (ref 150–400)
RBC: 4.12 MIL/uL (ref 3.87–5.11)
RDW: 13.8 % (ref 11.5–15.5)
WBC: 11.8 10*3/uL — AB (ref 4.0–10.5)

## 2017-06-01 MED ORDER — PRENATAL MULTIVITAMIN CH
1.0000 | ORAL_TABLET | Freq: Every day | ORAL | Status: DC
  Administered 2017-06-02: 1 via ORAL
  Filled 2017-06-01: qty 1

## 2017-06-01 MED ORDER — ONDANSETRON HCL 4 MG/2ML IJ SOLN: 4.0000 mg | INTRAMUSCULAR | Status: DC | PRN

## 2017-06-01 MED ORDER — LACTATED RINGERS IV SOLN: 500.0000 mL | Freq: Once | INTRAVENOUS | Status: DC

## 2017-06-01 MED ORDER — OXYTOCIN 40 UNITS IN LACTATED RINGERS INFUSION - SIMPLE MED: 2.5000 [IU]/h | INTRAVENOUS | Status: DC

## 2017-06-01 MED ORDER — LACTATED RINGERS IV SOLN: 500.0000 mL | INTRAVENOUS | Status: DC | PRN

## 2017-06-01 MED ORDER — IBUPROFEN 600 MG PO TABS
600.0000 mg | ORAL_TABLET | Freq: Four times a day (QID) | ORAL | Status: DC
  Administered 2017-06-01 – 2017-06-03 (×7): 600 mg via ORAL
  Filled 2017-06-01 (×7): qty 1

## 2017-06-01 MED ORDER — LACTATED RINGERS IV SOLN
INTRAVENOUS | Status: DC
  Administered 2017-06-01: 125 mL/h via INTRAVENOUS

## 2017-06-01 MED ORDER — DIPHENHYDRAMINE HCL 50 MG/ML IJ SOLN: 12.5000 mg | INTRAMUSCULAR | Status: DC | PRN

## 2017-06-01 MED ORDER — SOD CITRATE-CITRIC ACID 500-334 MG/5ML PO SOLN: 30.0000 mL | ORAL | Status: DC | PRN

## 2017-06-01 MED ORDER — ONDANSETRON HCL 4 MG PO TABS: 4.0000 mg | ORAL_TABLET | ORAL | Status: DC | PRN

## 2017-06-01 MED ORDER — TERBUTALINE SULFATE 1 MG/ML IJ SOLN
0.2500 mg | Freq: Once | INTRAMUSCULAR | Status: DC | PRN
  Filled 2017-06-01: qty 1

## 2017-06-01 MED ORDER — SODIUM CHLORIDE 0.9 % IV SOLN: INTRAVENOUS | Status: DC

## 2017-06-01 MED ORDER — DEXTROSE-NACL 5-0.45 % IV SOLN: INTRAVENOUS | Status: DC

## 2017-06-01 MED ORDER — OXYTOCIN BOLUS FROM INFUSION: 500.0000 mL | Freq: Once | INTRAVENOUS | Status: DC

## 2017-06-01 MED ORDER — SENNOSIDES-DOCUSATE SODIUM 8.6-50 MG PO TABS
2.0000 | ORAL_TABLET | ORAL | Status: DC
  Administered 2017-06-02 (×2): 2 via ORAL
  Filled 2017-06-01 (×2): qty 2

## 2017-06-01 MED ORDER — PHENYLEPHRINE 40 MCG/ML (10ML) SYRINGE FOR IV PUSH (FOR BLOOD PRESSURE SUPPORT)
80.0000 ug | PREFILLED_SYRINGE | INTRAVENOUS | Status: DC | PRN
  Filled 2017-06-01: qty 10
  Filled 2017-06-01: qty 5

## 2017-06-01 MED ORDER — OXYCODONE HCL 5 MG PO TABS
5.0000 mg | ORAL_TABLET | ORAL | Status: DC | PRN
  Administered 2017-06-02 – 2017-06-03 (×5): 5 mg via ORAL
  Filled 2017-06-01 (×5): qty 1

## 2017-06-01 MED ORDER — ACETAMINOPHEN 325 MG PO TABS: 650.0000 mg | ORAL_TABLET | ORAL | Status: DC | PRN

## 2017-06-01 MED ORDER — LIDOCAINE HCL (PF) 1 % IJ SOLN
INTRAMUSCULAR | Status: DC | PRN
  Administered 2017-06-01: 4 mL via EPIDURAL
  Administered 2017-06-01: 3 mL via EPIDURAL

## 2017-06-01 MED ORDER — SIMETHICONE 80 MG PO CHEW: 80.0000 mg | CHEWABLE_TABLET | ORAL | Status: DC | PRN

## 2017-06-01 MED ORDER — DEXTROSE 50 % IV SOLN: 25.0000 mL | INTRAVENOUS | Status: DC | PRN

## 2017-06-01 MED ORDER — INSULIN REGULAR BOLUS VIA INFUSION
0.0000 [IU] | Freq: Three times a day (TID) | INTRAVENOUS | Status: DC
  Filled 2017-06-01: qty 10

## 2017-06-01 MED ORDER — LACTATED RINGERS IV SOLN: INTRAVENOUS | Status: DC

## 2017-06-01 MED ORDER — BENZOCAINE-MENTHOL 20-0.5 % EX AERO
1.0000 "application " | INHALATION_SPRAY | CUTANEOUS | Status: DC | PRN
  Administered 2017-06-03: 1 via TOPICAL
  Filled 2017-06-01 (×2): qty 56

## 2017-06-01 MED ORDER — EPHEDRINE 5 MG/ML INJ
10.0000 mg | INTRAVENOUS | Status: DC | PRN
  Filled 2017-06-01: qty 2

## 2017-06-01 MED ORDER — OXYCODONE-ACETAMINOPHEN 5-325 MG PO TABS: 2.0000 | ORAL_TABLET | ORAL | Status: DC | PRN

## 2017-06-01 MED ORDER — ACETAMINOPHEN 325 MG PO TABS
650.0000 mg | ORAL_TABLET | ORAL | Status: DC | PRN
Start: 2017-06-01 — End: 2017-06-03

## 2017-06-01 MED ORDER — ZOLPIDEM TARTRATE 5 MG PO TABS: 5.0000 mg | ORAL_TABLET | Freq: Every evening | ORAL | Status: DC | PRN

## 2017-06-01 MED ORDER — SODIUM CHLORIDE 0.9 % IV SOLN
INTRAVENOUS | Status: DC
  Administered 2017-06-01: 0.9 [IU]/h via INTRAVENOUS
  Filled 2017-06-01: qty 1

## 2017-06-01 MED ORDER — COCONUT OIL OIL
1.0000 "application " | TOPICAL_OIL | Status: DC | PRN
  Administered 2017-06-03: 1 via TOPICAL
  Filled 2017-06-01: qty 120

## 2017-06-01 MED ORDER — ONDANSETRON HCL 4 MG/2ML IJ SOLN: 4.0000 mg | Freq: Four times a day (QID) | INTRAMUSCULAR | Status: DC | PRN

## 2017-06-01 MED ORDER — LIDOCAINE HCL (PF) 1 % IJ SOLN
30.0000 mL | INTRAMUSCULAR | Status: DC | PRN
  Filled 2017-06-01: qty 30

## 2017-06-01 MED ORDER — DIPHENHYDRAMINE HCL 25 MG PO CAPS: 25.0000 mg | ORAL_CAPSULE | Freq: Four times a day (QID) | ORAL | Status: DC | PRN

## 2017-06-01 MED ORDER — PHENYLEPHRINE 40 MCG/ML (10ML) SYRINGE FOR IV PUSH (FOR BLOOD PRESSURE SUPPORT)
80.0000 ug | PREFILLED_SYRINGE | INTRAVENOUS | Status: DC | PRN
  Filled 2017-06-01: qty 5

## 2017-06-01 MED ORDER — TETANUS-DIPHTH-ACELL PERTUSSIS 5-2.5-18.5 LF-MCG/0.5 IM SUSP: 0.5000 mL | Freq: Once | INTRAMUSCULAR | Status: DC

## 2017-06-01 MED ORDER — OXYCODONE-ACETAMINOPHEN 5-325 MG PO TABS: 1.0000 | ORAL_TABLET | ORAL | Status: DC | PRN

## 2017-06-01 MED ORDER — WITCH HAZEL-GLYCERIN EX PADS: 1.0000 "application " | MEDICATED_PAD | CUTANEOUS | Status: DC | PRN

## 2017-06-01 MED ORDER — PENICILLIN G POT IN DEXTROSE 60000 UNIT/ML IV SOLN
3.0000 10*6.[IU] | INTRAVENOUS | Status: DC
  Filled 2017-06-01 (×4): qty 50

## 2017-06-01 MED ORDER — PENICILLIN G POTASSIUM 5000000 UNITS IJ SOLR
5.0000 10*6.[IU] | Freq: Once | INTRAVENOUS | Status: AC
  Administered 2017-06-01: 5 10*6.[IU] via INTRAVENOUS
  Filled 2017-06-01: qty 5

## 2017-06-01 MED ORDER — DIBUCAINE 1 % RE OINT: 1.0000 "application " | TOPICAL_OINTMENT | RECTAL | Status: DC | PRN

## 2017-06-01 MED ORDER — OXYCODONE HCL 5 MG PO TABS: 10.0000 mg | ORAL_TABLET | ORAL | Status: DC | PRN

## 2017-06-01 MED ORDER — OXYTOCIN 40 UNITS IN LACTATED RINGERS INFUSION - SIMPLE MED
1.0000 m[IU]/min | INTRAVENOUS | Status: DC
  Administered 2017-06-01: 2 m[IU]/min via INTRAVENOUS
  Filled 2017-06-01: qty 1000

## 2017-06-01 MED ORDER — FENTANYL 2.5 MCG/ML BUPIVACAINE 1/10 % EPIDURAL INFUSION (WH - ANES)
14.0000 mL/h | INTRAMUSCULAR | Status: DC | PRN
  Administered 2017-06-01: 11 mL/h via EPIDURAL
  Filled 2017-06-01: qty 100

## 2017-06-01 NOTE — H&P (Signed)
Rose Patterson is a 22 y.o. female G1P0000 at 39+ for IOL given term status and favorable cervix.  SVE 4.5cm.  Some social issues - alcohol use in early pregnancy - questionable if continued, tobacco use.  Also GDM - controlled with metformin and Lantus.  Questionable compliance with treatment.  Varicella non-immune.  Dated by LMP c/w US.  Received Tdap in pregnancy.  Presented to L&D with SROM, clear  OB History    Gravida Para Term Preterm AB Living   1             SAB TAB Ectopic Multiple Live Births                G1 present  No abn pap + Chl   Past Medical History:  Diagnosis Date  . Eczema   . Ovarian cyst    Past Surgical History:  Procedure Laterality Date  . NO PAST SURGERIES     Family History: family history includes Asthma in her father; Cancer in her paternal grandmother; Diabetes in her father; Hypertension in her father; Kidney disease in her father. Social History:  reports that she has been smoking cigarettes.  She has a 1.25 pack-year smoking history. she has never used smokeless tobacco. She reports that she does not drink alcohol or use drugs. h/o alcohol use in early preg.  Female partner - h/o female; Childcare network  Meds Metformin, PNV, Lantus All NKDA     Maternal Diabetes: Yes:  Diabetes Type:  Pre-pregnancy Genetic Screening: Declined Maternal Ultrasounds/Referrals: Normal Fetal Ultrasounds or other Referrals:  None Maternal Substance Abuse:  Yes:  Type: Smoker, Other: ETOH in early pregnancy Significant Maternal Medications:  Meds include: Other: Lantus and metformin Significant Maternal Lab Results:  Lab values include: Group B Strep positive Other Comments:  None  Review of Systems  Constitutional: Negative.   HENT: Negative.   Eyes: Negative.   Respiratory: Negative.   Cardiovascular: Negative.   Gastrointestinal: Negative.   Genitourinary: Negative.   Musculoskeletal: Negative.   Skin: Negative.   Neurological: Negative.    Psychiatric/Behavioral: Negative.    Maternal Medical History:  Reason for admission: Rupture of membranes.   Contractions: Frequency: irregular.    Fetal activity: Perceived fetal activity is normal.    Prenatal Complications - Diabetes: gestational. Diabetes is managed by oral agent (dual therapy).        Last menstrual period 08/30/2016. Maternal Exam:  Abdomen: Patient reports no abdominal tenderness. Fundal height is appropriate for gestation.   Estimated fetal weight is 7-8#.   Fetal presentation: vertex  Introitus: Normal vulva. Normal vagina.  Cervix: Cervix evaluated by digital exam.     Physical Exam  Constitutional: She is oriented to person, place, and time. She appears well-developed and well-nourished.  HENT:  Head: Normocephalic and atraumatic.  Cardiovascular: Normal rate and regular rhythm.  Respiratory: Breath sounds normal. No respiratory distress. She has no wheezes.  GI: Soft. Bowel sounds are normal. She exhibits no distension. There is no tenderness.  Musculoskeletal: Normal range of motion.  Neurological: She is alert and oriented to person, place, and time.  Skin: Skin is warm and dry.  Psychiatric: She has a normal mood and affect. Her behavior is normal.    Prenatal labs: ABO, Rh: --/--/O POS (06/26 1046) Antibody:  neg Rubella:  immune RPR:   NR HBsAg:   neg HIV: Non Reactive (06/26 1045)  GBS:   positive  Hgb 11.5/Plt 292/Ur Cx neg/Chl neg/GC neg/Varicella non immune/Hgb electro  WNL/glucola elevated - 3hr = GDM/essential panel - CF, SMA, Fragile X neg  Nl anat, ant plac   Assessment/Plan: 21yo G1P0 for IOL Pitocin for gbbs prophylaxis Pitocin to augment Expect SVD  Rose Patterson 06/01/2017, 8:54 AM

## 2017-06-01 NOTE — L&D Delivery Note (Signed)
Delivery Note Pt pushed very well for 30 minutes.  At 3:08 PM a viable and healthy female was delivered via Vaginal, Spontaneous (Presentation: OA; ROT  ).  APGAR: 9, 9; weight  P.   Placenta status: delivered, intact .  Cord:  3 Vwith the following complications:none .  Anesthesia:  epidural Episiotomy:  none Lacerations: 2nd degree Suture Repair: 3.0 vicryl rapide Est. Blood Loss (mL): 250cc  Mom to postpartum.  Baby to Couplet care / Skin to Skin.  Rose Patterson 06/01/2017, 3:30 PM  Br/RI/Tdap in PNC/O+/Contra?    D/W pt circumcision for female infant inc r/b/a will likely proceed at office

## 2017-06-01 NOTE — Anesthesia Pain Management Evaluation Note (Signed)
  CRNA Pain Management Visit Note  Patient: Rose Patterson, 22 y.o., female  "Hello I am a [facility] the [facility]. We have an anesthesia team available at all times to provide care throughout the hospital, including epidural management and anesthesia for C-section. I don't know your plan for the delivery whether it a natural birth, water birth, IV sedation, nitrous supplementation, doula or epidural, but we want to meet your pain goals."   1.Was your pain managed to your expectations on prior hospitalizations?   No prior hospitalizations  2.What is your expectation for pain management during this hospitalization?     Epidural  3.How can we help you reach that goal?   Record the patient's initial score and the patient's pain goal.   Pain: 7  Pain Goal: 4 [facility] wants you to be able to say your pain was always managed very well.  Laban EmperorMalinova,Feliza Diven Hristova 06/01/2017

## 2017-06-01 NOTE — Anesthesia Preprocedure Evaluation (Addendum)
Anesthesia Evaluation  Patient identified by MRN, date of birth, ID band Patient awake    Reviewed: Allergy & Precautions, Patient's Chart, lab work & pertinent test results  Airway Mallampati: II  TM Distance: >3 FB Neck ROM: Full    Dental no notable dental hx. (+) Teeth Intact   Pulmonary Current Smoker,    Pulmonary exam normal breath sounds clear to auscultation       Cardiovascular negative cardio ROS Normal cardiovascular exam Rhythm:Regular Rate:Normal     Neuro/Psych negative neurological ROS  negative psych ROS   GI/Hepatic Neg liver ROS, GERD  ,  Endo/Other  diabetes, Poorly Controlled, Gestational, Insulin DependentMorbid obesity  Renal/GU negative Renal ROS  negative genitourinary   Musculoskeletal negative musculoskeletal ROS (+)   Abdominal (+) + obese,   Peds  Hematology  (+) anemia ,   Anesthesia Other Findings   Reproductive/Obstetrics (+) Pregnancy                            Anesthesia Physical Anesthesia Plan  ASA: III  Anesthesia Plan: Epidural   Post-op Pain Management:    Induction:   PONV Risk Score and Plan:   Airway Management Planned: Natural Airway  Additional Equipment:   Intra-op Plan:   Post-operative Plan:   Informed Consent: I have reviewed the patients History and Physical, chart, labs and discussed the procedure including the risks, benefits and alternatives for the proposed anesthesia with the patient or authorized representative who has indicated his/her understanding and acceptance.     Plan Discussed with: Anesthesiologist  Anesthesia Plan Comments:        Anesthesia Quick Evaluation

## 2017-06-01 NOTE — Progress Notes (Signed)
Patient ID: Rose Patterson, female   DOB: Sep 24, 1995, 24 y.o.   MRN: [id number]   Pt with pressure, aware of ctx.  Comfortable with epidural.    AFVSS gen NAD FHTs 130-150's, mod var, early decel toco Q322min  SVE 10/100/+2  Will start pushing, expect SVD

## 2017-06-01 NOTE — Anesthesia Procedure Notes (Signed)
Epidural Patient location during procedure: OB Start time: 06/01/2017 9:32 AM  Staffing Anesthesiologist: Mal AmabileFoster, Kolette Vey, MD Performed: anesthesiologist   Preanesthetic Checklist Completed: patient identified, site marked, surgical consent, pre-op evaluation, timeout performed, IV checked, risks and benefits discussed and monitors and equipment checked  Epidural Patient position: sitting Prep: site prepped and draped and DuraPrep Patient monitoring: continuous pulse ox and blood pressure Approach: midline Location: L4-L5 Injection technique: LOR air  Needle:  Needle type: Tuohy  Needle gauge: 17 G Needle length: 9 cm and 9 Needle insertion depth: 5 cm cm Catheter type: closed end flexible Catheter size: 19 Gauge Catheter at skin depth: 10 cm Test dose: negative and Other  Assessment Events: blood not aspirated, injection not painful, no injection resistance, negative IV test and no paresthesia  Additional Notes Patient identified. Risks and benefits discussed including failed block, incomplete  Pain control, post dural puncture headache, nerve damage, paralysis, blood pressure Changes, nausea, vomiting, reactions to medications-both toxic and allergic and post Partum back pain. All questions were answered. Patient expressed understanding and wished to proceed. Sterile technique was used throughout procedure. Epidural site was Dressed with sterile barrier dressing. No paresthesias, signs of intravascular injection Or signs of intrathecal spread were encountered.  Patient was more comfortable after the epidural was dosed. Please see RN's note for documentation of vital signs and FHR which are stable.

## 2017-06-02 ENCOUNTER — Other Ambulatory Visit: Payer: Self-pay

## 2017-06-02 LAB — CBC
HCT: 27.6 % — ABNORMAL LOW (ref 36.0–46.0)
HEMOGLOBIN: 9.4 g/dL — AB (ref 12.0–15.0)
MCH: 28.1 pg (ref 26.0–34.0)
MCHC: 34.1 g/dL (ref 30.0–36.0)
MCV: 82.6 fL (ref 78.0–100.0)
PLATELETS: 192 10*3/uL (ref 150–400)
RBC: 3.34 MIL/uL — AB (ref 3.87–5.11)
RDW: 14.2 % (ref 11.5–15.5)
WBC: 13.8 10*3/uL — ABNORMAL HIGH (ref 4.0–10.5)

## 2017-06-02 NOTE — Plan of Care (Signed)
  Life Cycle: Chance of risk for complications during the postpartum period will decrease 06/02/2017 1102 - Completed/Met by Tollie Eth, RN Note Observed patient breast feed baby. Discussed proper positioning, proper latch and signs of incorrect latch.. Patient appears very comfortable with latching baby. Encouraged patient to keep baby close to her to prevent baby from pulling at nipple and having a shallow latch. Maxwell Caul, Leretha Dykes [place]

## 2017-06-02 NOTE — Progress Notes (Signed)
Post Partum Day 1 Subjective: no complaints, up ad lib and tolerating PO  Breastfeeding going well  Objective: Blood pressure (!) 124/56, pulse 98, temperature 98 F (36.7 C), temperature source Oral, resp. rate 18, height 5\' 1"  (1.549 m), weight 108.1 kg (238 lb 6.4 oz), last menstrual period 08/30/2016, SpO2 98 %, unknown if currently breastfeeding.  Physical Exam:  General: alert and cooperative Lochia: appropriate Uterine Fundus: firm   Recent Labs    06/01/17 0830 06/02/17 0552  HGB 11.5* 9.4*  HCT 33.9* 27.6*    Assessment/Plan: Plan for discharge tomorrow  Plans circumcision in office   LOS: 1 day   Rose Patterson 06/02/2017, 7:29 AM

## 2017-06-02 NOTE — Anesthesia Postprocedure Evaluation (Signed)
Anesthesia Post Note  Patient: Rose Patterson  Procedure(s) Performed: AN AD HOC LABOR EPIDURAL     Patient location during evaluation: Mother Baby Anesthesia Type: Epidural Level of consciousness: awake Pain management: satisfactory to patient Vital Signs Assessment: post-procedure vital signs reviewed and stable Respiratory status: spontaneous breathing Cardiovascular status: stable Anesthetic complications: no    Last Vitals:  Vitals:   06/01/17 2137 06/02/17 0602  BP: 124/68 (!) 124/56  Pulse: 85 98  Resp: 18 18  Temp: 36.9 C 36.7 C  SpO2:      Last Pain:  Vitals:   06/02/17 0602  TempSrc: Oral  PainSc: 0-No pain   Pain Goal:                 KeyCorpBURGER,Eveline Sauve

## 2017-06-02 NOTE — Lactation Note (Signed)
This note was copied from a baby's chart. Lactation Consultation Note  Patient Name: Rose Patterson Reason for consult: Initial assessment   P1, Baby 22 hours old.  Reviewed hand expression with drops expressed. Assisted w/ latching in football hold.  At first mother winced in pain when she latched him. Helped her compress breast and bring him on deeper and she felt more comfortable. Intermittent sucks and swallows observed. Mother has large breasts and she pulls back the tissue while he is breastfeeding. Provided education how infants breathe while breastfeeding. Had mother compress breast to keep him active. Mom encouraged to feed baby 8-12 times/24 hours and with feeding cues.  Suggest she apply ebm if she becomes sore. Discussed basics including cluster feeding. Mom made aware of O/P services, breastfeeding support groups, community resources, and our phone # for post-discharge questions.     Maternal Data Has patient been taught Hand Expression?: Yes Does the patient have breastfeeding experience prior to this delivery?: No  Feeding Feeding Type: Breast Fed Length of feed: 30 min  LATCH Score Latch: Grasps breast easily, tongue down, lips flanged, rhythmical sucking.  Audible Swallowing: Spontaneous and intermittent  Type of Nipple: Everted at rest and after stimulation  Comfort (Breast/Nipple): Soft / non-tender  Hold (Positioning): Assistance needed to correctly position infant at breast and maintain latch.  LATCH Score: 9  Interventions Interventions: Assisted with latch;Breast feeding basics reviewed;Skin to skin;Breast massage;Hand express;Breast compression;Expressed milk  Lactation Tools Discussed/Used     Consult Status Consult Status: Follow-up Date: 06/03/17 Follow-up type: In-patient    Dahlia ByesBerkelhammer, Ruth [facility] Ridge Surgery CenterBoschen Patterson, 2:02 PM

## 2017-06-03 LAB — GLUCOSE, CAPILLARY: Glucose-Capillary: 84 mg/dL (ref 65–99)

## 2017-06-03 LAB — RPR: RPR Ser Ql: NONREACTIVE

## 2017-06-03 MED ORDER — PRENATAL MULTIVITAMIN CH: 1.0000 | ORAL_TABLET | Freq: Every day | ORAL | 3 refills | Status: DC

## 2017-06-03 MED ORDER — IBUPROFEN 600 MG PO TABS: 600.0000 mg | ORAL_TABLET | Freq: Four times a day (QID) | ORAL | 1 refills | Status: DC | PRN

## 2017-06-03 NOTE — Progress Notes (Signed)
Post Partum Day 2 Subjective: no complaints, up ad lib, voiding, tolerating PO and nl lochia, pain controlled  Objective: Blood pressure 138/89, pulse (!) 105, temperature 98.2 F (36.8 C), temperature source Oral, resp. rate 16, height 5\' 1"  (1.549 m), weight 108.1 kg (238 lb 6.4 oz), last menstrual period 08/30/2016, SpO2 98 %, unknown if currently breastfeeding.  Physical Exam:  General: alert and no distress Lochia: appropriate Uterine Fundus: firm  Recent Labs    06/01/17 0830 06/02/17 0552  HGB 11.5* 9.4*  HCT 33.9* 27.6*    Assessment/Plan: Discharge home, Breastfeeding and Lactation consult.  Routine PP care - d/c with motrin and PNV, f/u 6 weeks   LOS: 2 days   Rose Patterson 06/03/2017, 7:40 AM

## 2017-06-03 NOTE — Discharge Summary (Signed)
OB Discharge Summary     Patient Name: Rose Patterson DOB: 12/27/1995 MRN: [id number]  Date of admission: 06/01/2017 Delivering MD: Sherian ReinBOVARD-STUCKERT, Viet Kemmerer   Date of discharge: 06/03/2017  Admitting diagnosis: INDUCTION Intrauterine pregnancy: 10077w2d     Secondary diagnosis:  Principal Problem:   SVD (spontaneous vaginal delivery) Active Problems:   Indication for care in labor or delivery  Additional problems: A2 GDM     Discharge diagnosis: Term Pregnancy Delivered                                                                                                Post partum procedures:none  Augmentation: AROM and Pitocin  Complications: None  Hospital course:  Induction of Labor With Vaginal Delivery   22 y.o. yo G1P1001 at 3977w2d was admitted to the hospital 06/01/2017 for induction of labor.  Indication for induction: A2 DM.  Patient had an uncomplicated labor course as follows: Membrane Rupture Time/Date: 7:30 AM ,06/01/2017   Intrapartum Procedures: Episiotomy:                                           Lacerations:  2nd degree [3]  Patient had delivery of a Viable infant.  Information for the patient's newborn:  Derek JackRitter, Boy Ellawyn [[id number]][[id number]]  Delivery Method: Vaginal, Spontaneous(Filed from Delivery Summary)   06/01/2017  Details of delivery can be found in separate delivery note.  Patient had a routine postpartum course. Patient is discharged home 06/03/17.  Physical exam  Vitals:   06/01/17 2137 06/02/17 0602 06/02/17 1822 06/03/17 0539  BP: 124/68 (!) 124/56 129/70 138/89  Pulse: 85 98 81 (!) 105  Resp: 18 18 19 16   Temp: 98.5 F (36.9 C) 98 F (36.7 C) 97.8 F (36.6 C) 98.2 F (36.8 C)  TempSrc: Oral Oral Oral Oral  SpO2:      Weight:      Height:       General: alert and no distress Lochia: appropriate Uterine Fundus: firm  Labs: Lab Results  Component Value Date   WBC 13.8 (H) 06/02/2017   HGB 9.4 (L) 06/02/2017   HCT 27.6 (L) 06/02/2017   MCV  82.6 06/02/2017   PLT 192 06/02/2017   No flowsheet data found.  Discharge instruction: per After Visit Summary and "Baby and Me Booklet".  After visit meds:  Allergies as of 06/03/2017   No Known Allergies     Medication List    STOP taking these medications   insulin glargine 100 UNIT/ML injection Commonly known as:  LANTUS   metFORMIN 500 MG tablet Commonly known as:  GLUCOPHAGE     TAKE these medications   calcium carbonate 500 MG chewable tablet Commonly known as:  TUMS - dosed in mg elemental calcium Chew 2 tablets by mouth daily as needed for indigestion or heartburn.   ibuprofen 600 MG tablet Commonly known as:  ADVIL,MOTRIN Take 1 tablet (600 mg total) by mouth every 6 (six) hours as needed.   prenatal  multivitamin Tabs tablet Take 1 tablet by mouth at bedtime.   triamcinolone cream 0.1 % Commonly known as:  KENALOG triamcinolone acetonide 0.1 % topical cream  APPLY A THIN LAYER TO THE AFFECTED AREA(S) BY TOPICAL ROUTE 2 TIMES PER DAY       Diet: routine diet  Activity: Advance as tolerated. Pelvic rest for 6 weeks.   Outpatient follow up:6 weeks Follow up Appt:No future appointments. Follow up Visit:No Follow-up on file.  Postpartum contraception: Undecided  Newborn Data: Live born female  Birth Weight: 6 lb 10 oz (3005 g) APGAR: 9, 9  Newborn Delivery   Birth date/time:  06/01/2017 15:08:00 Delivery type:  Vaginal, Spontaneous     Baby Feeding: Breast Disposition:home with mother   06/03/2017 Sherian Rein, MD

## 2019-02-07 IMAGING — US US OB COMP LESS 14 WK
1 series · 15 of 28 positions shown · non-contrast
Comparison: None.

CLINICAL DATA: Vaginal bleeding

EXAM:
OBSTETRIC <14 WK ULTRASOUND
TECHNIQUE: Transabdominal ultrasound was performed for evaluation of the
gestation as well as the maternal uterus and adnexal regions.

[Series 1: us ob comp less 14 wk · 34 acquisitions, 15 frames shown]
[im 1/34]
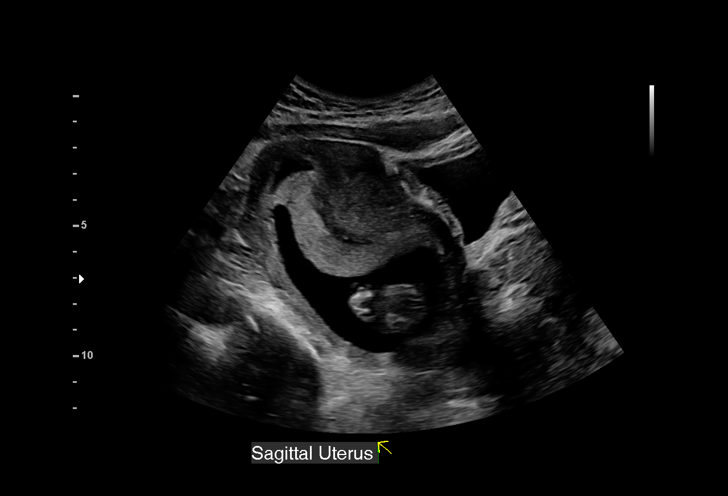
[im 3/34]
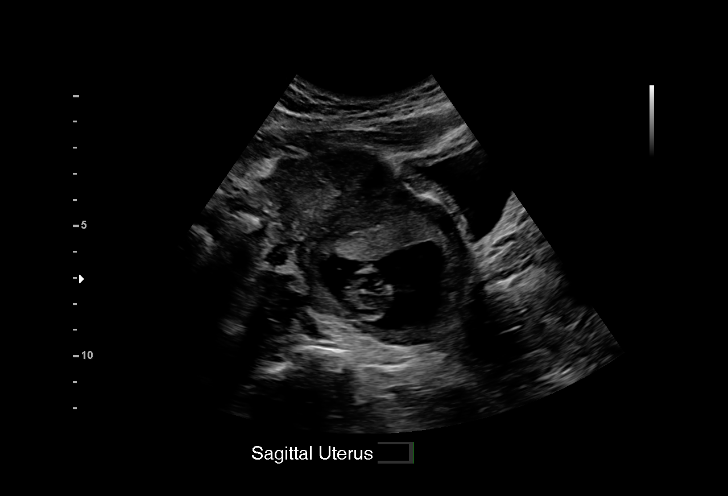
[im 5/34]
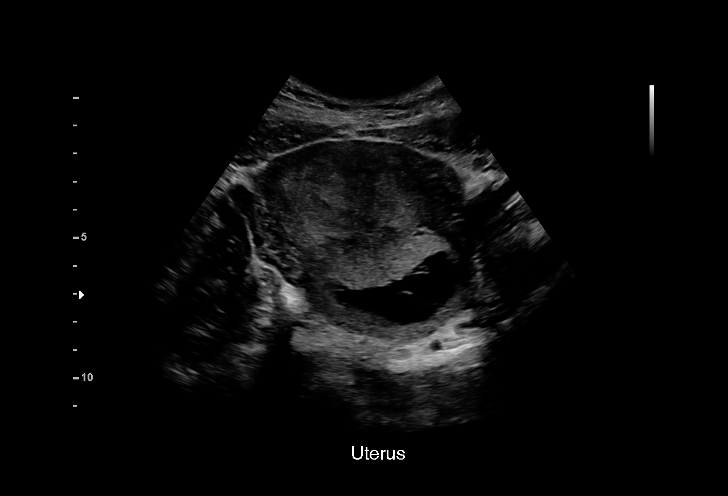
[im 8/34]
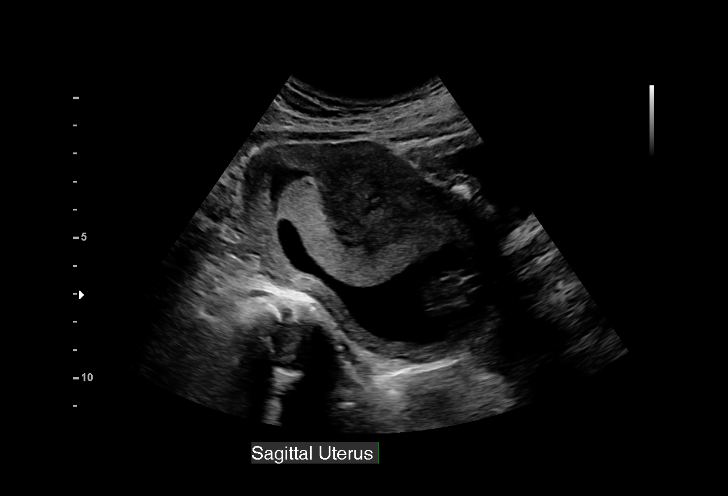
[im 10/34]
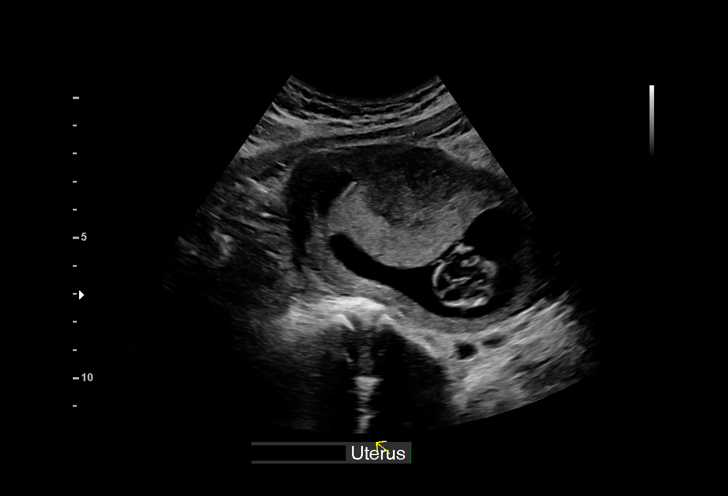
[im 13/34]
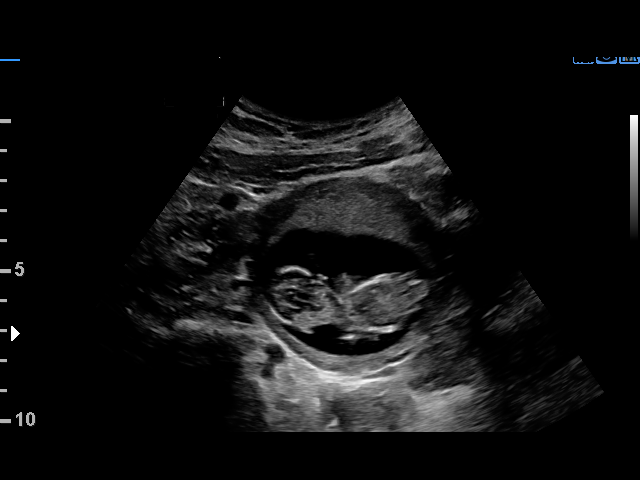
[im 15/34]
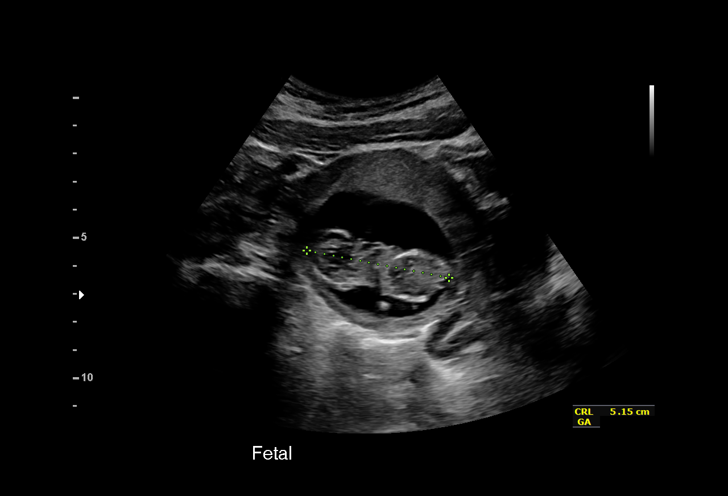
[im 18/34]
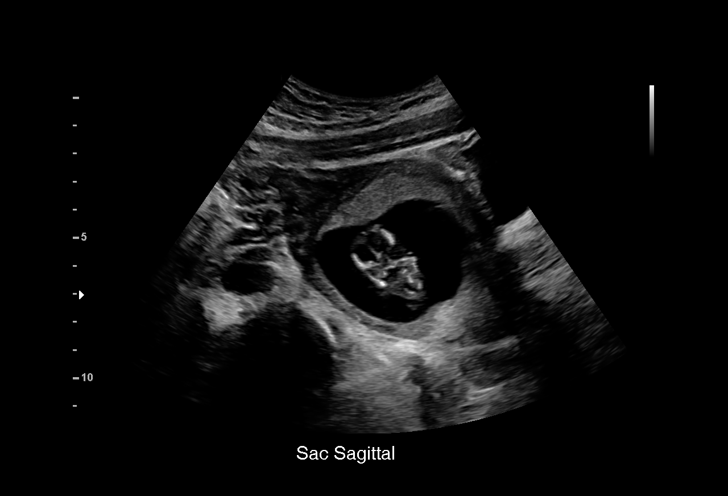
[im 19/34]
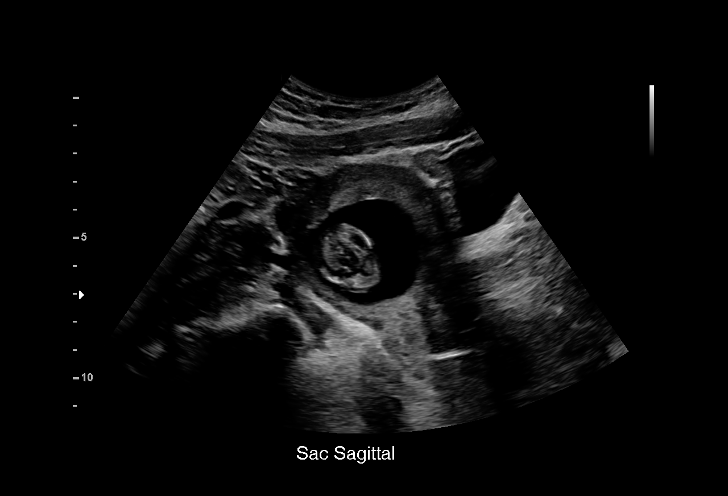
[im 21/34]
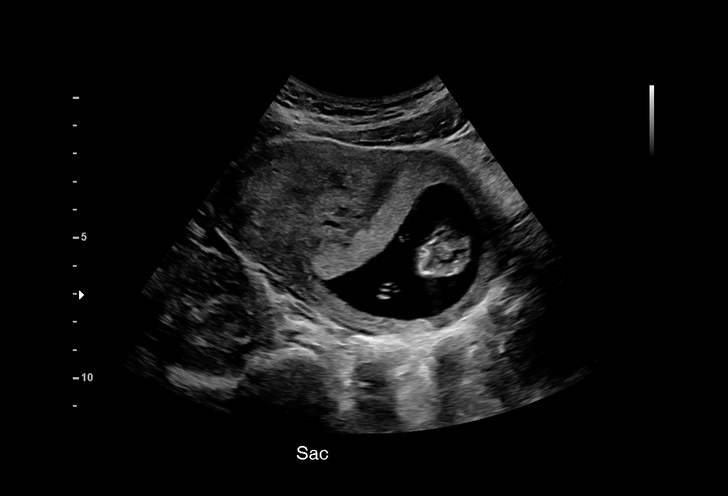
[im 24/34]
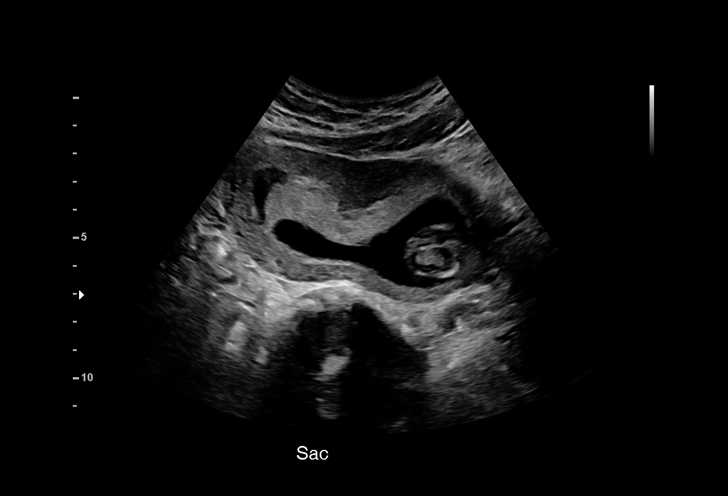
[im 26/34]
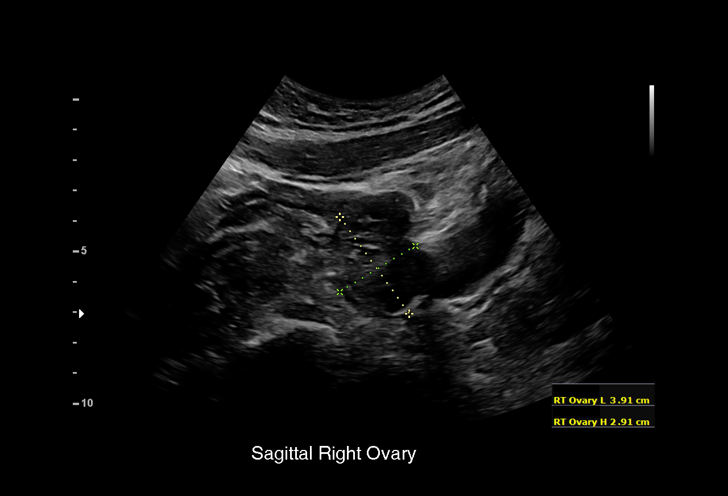
[im 29/34]
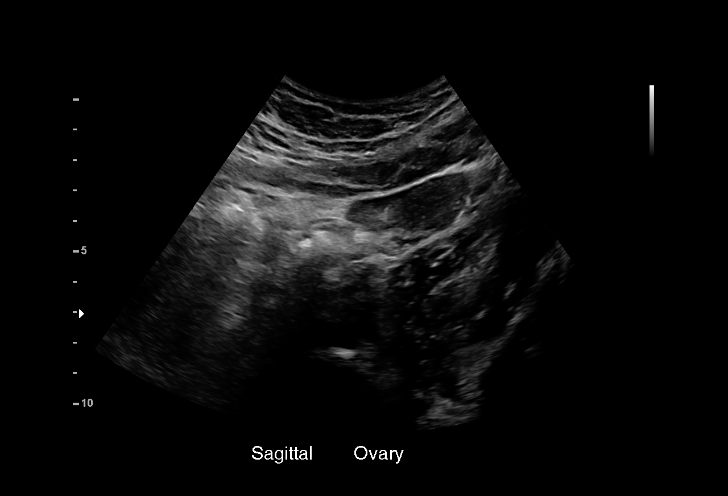
[im 31/34]
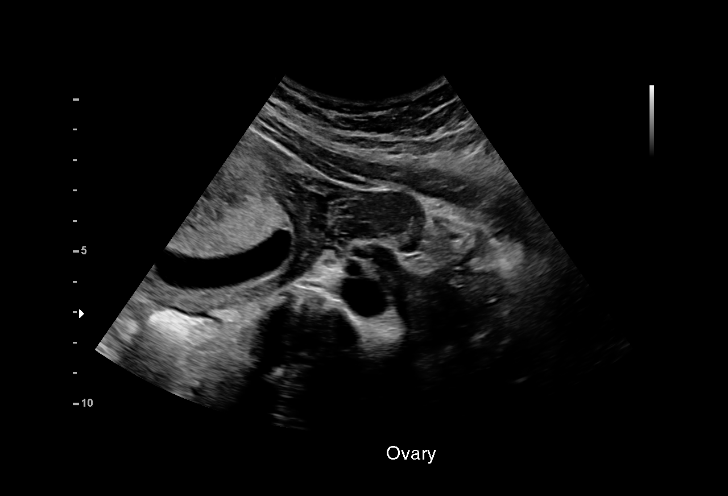
[im 34/34]
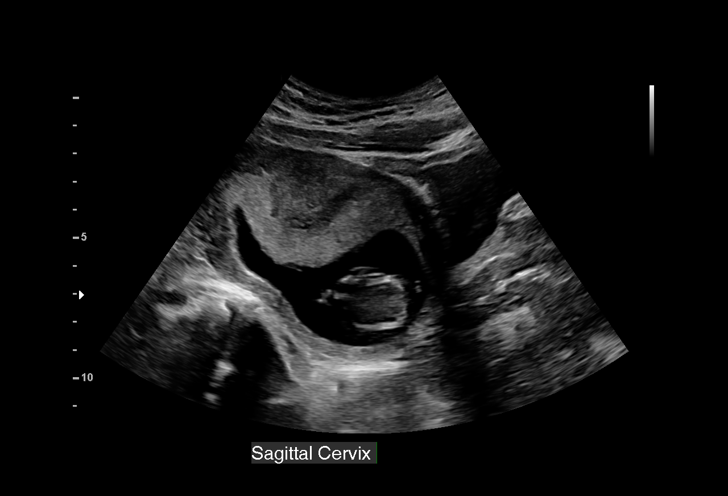

[15 of 28 positions shown; findings below may reference images not displayed]

FINDINGS: Intrauterine gestational sac: Single

Yolk sac:  Visualized

Embryo:  Visualized

Cardiac Activity: Visualize

Heart Rate: 159 bpm

MSD:   mm    w     d

CRL:   53.6  mm   12 w 0 d                  US EDC: 06/08/2017

Subchorionic hemorrhage:  Small subchorionic hemorrhage

Maternal uterus/adnexae: No adnexal mass or free fluid.
IMPRESSION: Twelve week pregnancy with fetal heart rate 159 beats per minute.
Small subchorionic hemorrhage.

## 2019-08-22 ENCOUNTER — Encounter: Payer: Self-pay | Admitting: Family Medicine

## 2019-08-22 ENCOUNTER — Other Ambulatory Visit: Payer: Self-pay

## 2019-08-22 ENCOUNTER — Ambulatory Visit: Payer: Medicaid Other | Admitting: Family Medicine

## 2019-08-22 VITALS — BP 125/85 | HR 75 | Temp 98.5°F | Ht 61.0 in | Wt 242.0 lb

## 2019-08-22 DIAGNOSIS — R7303 Prediabetes: Secondary | ICD-10-CM | POA: Diagnosis not present

## 2019-08-22 DIAGNOSIS — F411 Generalized anxiety disorder: Secondary | ICD-10-CM

## 2019-08-22 DIAGNOSIS — Z6841 Body Mass Index (BMI) 40.0 and over, adult: Secondary | ICD-10-CM

## 2019-08-22 DIAGNOSIS — L309 Dermatitis, unspecified: Secondary | ICD-10-CM

## 2019-08-22 DIAGNOSIS — G8929 Other chronic pain: Secondary | ICD-10-CM

## 2019-08-22 DIAGNOSIS — M549 Dorsalgia, unspecified: Secondary | ICD-10-CM

## 2019-08-22 MED ORDER — TRIAMCINOLONE ACETONIDE 0.1 % EX CREA: TOPICAL_CREAM | CUTANEOUS | 0 refills | Status: AC

## 2019-08-22 MED ORDER — IBUPROFEN 600 MG PO TABS: 600.0000 mg | ORAL_TABLET | Freq: Four times a day (QID) | ORAL | 1 refills | Status: AC | PRN

## 2019-08-22 MED ORDER — DULOXETINE HCL 60 MG PO CPEP: 60.0000 mg | ORAL_CAPSULE | Freq: Every day | ORAL | 0 refills | Status: DC

## 2019-08-22 MED ORDER — TRIAMCINOLONE ACETONIDE 0.1 % EX CREA: TOPICAL_CREAM | CUTANEOUS | 0 refills | Status: DC

## 2019-08-22 NOTE — Progress Notes (Signed)
New Patient Office Visit  Subjective:  Patient ID: Rose Patterson, female    DOB: Jan 12, 1996  Age: 24 y.o. MRN: [id number]  CC:  Chief Complaint  Patient presents with  . New Patient (Initial Visit)  . Anxiety  . Back Pain    HPI Rose Patterson presents to establish care.   New Patient : PMHx: ecezma, ovarian cyst    Meds: multivitamin, pheniramine     ALL: no drug or environmental allergies    SurgHx: None   GYNHx: G1 P1001   Patient's last menstrual period was 08/14/2019.  Uses condoms for contraception.  Last PAP 2020. No known hx of breast cancer.      FMHx: Father: HTN, DM, ESRD, asthma.  Paternal grandmother: colon cancer.     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  Drinks alcohol occasionally about 1-2 a week.  Started smoking cigarettes in 2013, 0.25 ppd for 9 years (~ 2.5 pk years).    Back Pain Chronic daily back pain located underneath her bra in the back. Has gained some weight and her breasts have gotten bigger.  Recently got a new bra that helped improve pain.  Pain is intermittent for the past 3 months. Described as sharp and stabby. At times has sleep disturbance due to pain. Has not tried any medications for pain.   Anxiety  Reports ongoing chronic anxiety that is worsening.  Finds herself not connecting with her child as she use to. Is more tired than normal.  Has trouble sleeping as she "1001 things on my mind." Feels as if her mind "goes crazy."  Reports irritability, palpitations, feeling hot and twitchy during some of her episodes. Started a new job but feels at times she is unable to stay focused.  Uses breathing techniques to calm down. Has not used medication for this in the past.  Does not have a therapist.       ROS Review of Systems  Constitutional: Negative for fever.  HENT: Negative for congestion, sneezing and sore throat.   Eyes:       Wears glasses   Respiratory: Negative for cough, shortness of breath and  wheezing.   Cardiovascular: Positive for palpitations. Negative for chest pain.  Gastrointestinal: Negative for abdominal pain, blood in stool, constipation, diarrhea, nausea and vomiting.  Genitourinary: Negative for dysuria and vaginal bleeding.  Musculoskeletal: Positive for back pain. Negative for neck pain.  Skin: Positive for rash.  Neurological: Positive for headaches.  Psychiatric/Behavioral: Positive for sleep disturbance. Negative for suicidal ideas. The patient is nervous/anxious.     Objective:   Today's Vitals: BP 125/85   Pulse 75   Temp 98.5 F (36.9 C) (Oral)   Ht 5\' 1"  (1.549 m)   Wt 109.8 kg   LMP 08/14/2019   BMI 45.73 kg/m   Physical Exam Constitutional:      Appearance: Normal appearance.  HENT:     Head: Normocephalic and atraumatic.     Right Ear: Tympanic membrane normal. There is no impacted cerumen.     Left Ear: Tympanic membrane normal. There is no impacted cerumen.     Nose: Nose normal.     Mouth/Throat:     Mouth: Mucous membranes are moist.     Pharynx: Oropharynx is clear. No oropharyngeal exudate or posterior oropharyngeal erythema.  Eyes:     General: No scleral icterus.    Extraocular Movements: Extraocular movements intact.     Conjunctiva/sclera: Conjunctivae normal.  Pupils: Pupils are equal, round, and reactive to light.  Cardiovascular:     Rate and Rhythm: Normal rate and regular rhythm.     Pulses: Normal pulses.     Heart sounds: Normal heart sounds. No murmur.  Pulmonary:     Effort: Pulmonary effort is normal. No respiratory distress.     Breath sounds: Normal breath sounds.  Abdominal:     General: Bowel sounds are normal.     Palpations: There is no mass.     Tenderness: There is no abdominal tenderness. There is no right CVA tenderness, left CVA tenderness, guarding or rebound.     Comments: Obese abdomen   Musculoskeletal:        General: No tenderness. Normal range of motion.     Cervical back: Normal range of  motion and neck supple. No rigidity or tenderness.     Right lower leg: No edema.     Left lower leg: No edema.  Lymphadenopathy:     Cervical: No cervical adenopathy.  Skin:    General: Skin is warm and dry.     Capillary Refill: Capillary refill takes less than 2 seconds.     Findings: Rash present.     Comments: Dry, macular rash on upper back   Neurological:     General: No focal deficit present.     Mental Status: She is alert and oriented to person, place, and time.     Coordination: Coordination normal.     Comments: CN 2-12 grossly intact   Psychiatric:        Attention and Perception: Attention and perception normal.        Mood and Affect: Mood is anxious. Affect is tearful.        Speech: Speech normal.        Behavior: Behavior normal. Behavior is cooperative.        Thought Content: Thought content does not include homicidal or suicidal ideation. Thought content does not include homicidal or suicidal plan.        Cognition and Memory: Cognition and memory normal.        Judgment: Judgment normal.     Assessment & Plan:   Problem List Items Addressed This Visit      Musculoskeletal and Integument   Eczema    Prescription sent for Kenalog cream 0.1%       Relevant Medications   triamcinolone cream (KENALOG) 0.1 %     Other   Obesity    Obtain lipid panel. Discussed healthy eating habits and need to exercise. Patient would like to lose weight.  - Encourage healthy eating habits and weight loss       Relevant Orders   Comprehensive metabolic panel (Completed)   Hemoglobin A1c (Completed)   Lipid Panel (Completed)   Pre-diabetes - Primary    a1c 5.8.  Will obtain CMP and lipid panel. Discussed need for weight loss and healthy eating habits.   - Reassess in 6 months       Back pain    Chronic.  Start ibuprofen 600 mg every 6-8 hours as needed for back pain.  No midline tenderness on exam.  Likely MSK in nature.  Not likely fracture, radiculopathy or spinal  mass.  Encouraged patient to get more well fitting bras to support her breasts. Discussed weight loss to decrease breast size and patient reported increasing breast size with recent weight gain.  - Consider muscle relaxer if not improving  Relevant Medications   ibuprofen (ADVIL) 600 MG tablet   GAD (generalized anxiety disorder)    GAD7 score: 14 (moderate anxiety).   - Start Cymbalta 60 mg, titrate up  - Patient could benefit from CBT  - Follow up in 4 weeks for medication titration   GAD 7 : Generalized Anxiety Score 08/28/2019  Nervous, Anxious, on Edge 2  Control/stop worrying 2  Worry too much - different things 3  Trouble relaxing 2  Restless 0  Easily annoyed or irritable 3  Afraid - awful might happen 2  Total GAD 7 Score 14  Anxiety Difficulty Very difficult           Relevant Medications   DULoxetine (CYMBALTA) 60 MG capsule      Outpatient Encounter Medications as of 08/22/2019  Medication Sig  . calcium carbonate (TUMS - DOSED IN MG ELEMENTAL CALCIUM) 500 MG chewable tablet Chew 2 tablets by mouth daily as needed for indigestion or heartburn.  . DULoxetine (CYMBALTA) 60 MG capsule Take 1 capsule (60 mg total) by mouth daily.  Marland Kitchen ibuprofen (ADVIL) 600 MG tablet Take 1 tablet (600 mg total) by mouth every 6 (six) hours as needed.  . triamcinolone cream (KENALOG) 0.1 % triamcinolone acetonide 0.1 % topical cream  APPLY A THIN LAYER TO THE AFFECTED AREA(S) BY TOPICAL ROUTE 2 TIMES PER DAY   No facility-administered encounter medications on file as of 08/22/2019.    Follow-up: Return in about 4 weeks (around 09/19/2019).     Katha Cabal, DO    This note is not being shared with the patient for the following reason: To prevent harm (release of this note would result in harm to the life or physical safety of the patient or another).

## 2019-08-22 NOTE — Patient Instructions (Addendum)
It was great seeing you today!   I'd like to see you back in 4 weeks for follow up but if you need to be seen earlier than that for any new issues we're happy to fit you in, just give Rose Patterson a call!  - Stop by the pharmacy to pick up your medications    If you have questions or concerns please do not hesitate to call at [phone].  Dr. [facility]    Generalized Anxiety Disorder, Adult Generalized anxiety disorder (GAD) is a mental health disorder. People with this condition constantly worry about everyday events. Unlike normal anxiety, worry related to GAD is not triggered by a specific event. These worries also do not fade or get better with time. GAD interferes with life functions, including relationships, work, and school. GAD can vary from mild to severe. People with severe GAD can have intense waves of anxiety with physical symptoms (panic attacks). What are the causes? The exact cause of GAD is not known. What increases the risk? This condition is more likely to develop in:  Women.  People who have a family history of anxiety disorders.  People who are very shy.  People who experience very stressful life events, such as the death of a loved one.  People who have a very stressful family environment. What are the signs or symptoms? People with GAD often worry excessively about many things in their lives, such as their health and family. They may also be overly concerned about:  Doing well at work.  Being on time.  Natural disasters.  Friendships. Physical symptoms of GAD include:  Fatigue.  Muscle tension or having muscle twitches.  Trembling or feeling shaky.  Being easily startled.  Feeling like your heart is pounding or racing.  Feeling out of breath or like you cannot take a deep breath.  Having trouble falling asleep or staying asleep.  Sweating.  Nausea, diarrhea, or irritable bowel syndrome (IBS).  Headaches.   Trouble concentrating or remembering facts.  Restlessness.  Irritability. How is this diagnosed? Your health care provider can diagnose GAD based on your symptoms and medical history. You will also have a physical exam. The health care provider will ask specific questions about your symptoms, including how severe they are, when they started, and if they come and go. Your health care provider may ask you about your use of alcohol or drugs, including prescription medicines. Your health care provider may refer you to a mental health specialist for further evaluation. Your health care provider will do a thorough examination and may perform additional tests to rule out other possible causes of your symptoms. To be diagnosed with GAD, a person must have anxiety that:  Is out of his or her control.  Affects several different aspects of his or her life, such as work and relationships.  Causes distress that makes him or her unable to take part in normal activities.  Includes at least three physical symptoms of GAD, such as restlessness, fatigue, trouble concentrating, irritability, muscle tension, or sleep problems. Before your health care provider can confirm a diagnosis of GAD, these symptoms must be present more days than they are not, and they must last for six months or longer. How is this treated? The following therapies are usually used to treat GAD:  Medicine. Antidepressant medicine is usually prescribed for long-term daily control. Antianxiety medicines may be added in severe cases, especially when panic attacks occur.  Talk therapy (psychotherapy). Certain  types of talk therapy can be helpful in treating GAD by providing support, education, and guidance. Options include: ? Cognitive behavioral therapy (CBT). People learn coping skills and techniques to ease their anxiety. They learn to identify unrealistic or negative thoughts and behaviors and to replace them with positive ones. ?  Acceptance and commitment therapy (ACT). This treatment teaches people how to be mindful as a way to cope with unwanted thoughts and feelings. ? Biofeedback. This process trains you to manage your body's response (physiological response) through breathing techniques and relaxation methods. You will work with a therapist while machines are used to monitor your physical symptoms.  Stress management techniques. These include yoga, meditation, and exercise. A mental health specialist can help determine which treatment is best for you. Some people see improvement with one type of therapy. However, other people require a combination of therapies. Follow these instructions at home:  Take over-the-counter and prescription medicines only as told by your health care provider.  Try to maintain a normal routine.  Try to anticipate stressful situations and allow extra time to manage them.  Practice any stress management or self-calming techniques as taught by your health care provider.  Do not punish yourself for setbacks or for not making progress.  Try to recognize your accomplishments, even if they are small.  Keep all follow-up visits as told by your health care provider. This is important. Contact a health care provider if:  Your symptoms do not get better.  Your symptoms get worse.  You have signs of depression, such as: ? A persistently sad, cranky, or irritable mood. ? Loss of enjoyment in activities that used to bring you joy. ? Change in weight or eating. ? Changes in sleeping habits. ? Avoiding friends or family members. ? Loss of energy for normal tasks. ? Feelings of guilt or worthlessness. Get help right away if:  You have serious thoughts about hurting yourself or others. If you ever feel like you may hurt yourself or others, or have thoughts about taking your own life, get help right away. You can go to your nearest emergency department or call:  Your local emergency  services (911 in the [place]  A suicide crisis helpline, such as the National Suicide Prevention Lifeline at 830-741-9387. This is open 24 hours a day. Summary  Generalized anxiety disorder (GAD) is a mental health disorder that involves worry that is not triggered by a specific event.  People with GAD often worry excessively about many things in their lives, such as their health and family.  GAD may cause physical symptoms such as restlessness, trouble concentrating, sleep problems, frequent sweating, nausea, diarrhea, headaches, and trembling or muscle twitching.  A mental health specialist can help determine which treatment is best for you. Some people see improvement with one type of therapy. However, other people require a combination of therapies. This information is not intended to replace advice given to you by your health care provider. Make sure you discuss any questions you have with your health care provider. Document Revised: 04/30/2017 Document Reviewed: 04/07/2016 Elsevier Patient Education  2020 [employer].

## 2019-08-23 LAB — COMPREHENSIVE METABOLIC PANEL
ALT: 22 IU/L (ref 0–32)
AST: 22 IU/L (ref 0–40)
Albumin/Globulin Ratio: 1.2 (ref 1.2–2.2)
Albumin: 4.1 g/dL (ref 3.9–5.0)
Alkaline Phosphatase: 101 IU/L (ref 39–117)
BUN/Creatinine Ratio: 20 (ref 9–23)
BUN: 15 mg/dL (ref 6–20)
Bilirubin Total: <0.2 mg/dL (ref 0.0–1.2)
CO2: 21 mmol/L (ref 20–29)
Calcium: 9.5 mg/dL (ref 8.7–10.2)
Chloride: 107 mmol/L — ABNORMAL HIGH (ref 96–106)
Creatinine, Ser: 0.74 mg/dL (ref 0.57–1.00)
GFR calc Af Amer: 130 mL/min/{1.73_m2} (ref >59)
GFR calc non Af Amer: 113 mL/min/{1.73_m2} (ref >59)
Globulin, Total: 3.4 g/dL (ref 1.5–4.5)
Glucose: 87 mg/dL (ref 65–99)
Potassium: 4.4 mmol/L (ref 3.5–5.2)
Sodium: 143 mmol/L (ref 134–144)
Total Protein: 7.5 g/dL (ref 6.0–8.5)

## 2019-08-23 LAB — LIPID PANEL
Chol/HDL Ratio: 2.7 ratio (ref 0.0–4.4)
Cholesterol, Total: 129 mg/dL (ref 100–199)
HDL: 48 mg/dL (ref >39)
LDL Chol Calc (NIH): 61 mg/dL (ref 0–99)
Triglycerides: 109 mg/dL (ref 0–149)
VLDL Cholesterol Cal: 20 mg/dL (ref 5–40)

## 2019-08-23 LAB — HEMOGLOBIN A1C
Est. average glucose Bld gHb Est-mCnc: 120 mg/dL
Hgb A1c MFr Bld: 5.8 % — ABNORMAL HIGH (ref 4.8–5.6)

## 2019-08-28 DIAGNOSIS — L309 Dermatitis, unspecified: Secondary | ICD-10-CM | POA: Insufficient documentation

## 2019-08-28 DIAGNOSIS — R7303 Prediabetes: Secondary | ICD-10-CM | POA: Insufficient documentation

## 2019-08-28 DIAGNOSIS — M549 Dorsalgia, unspecified: Secondary | ICD-10-CM | POA: Insufficient documentation

## 2019-08-28 DIAGNOSIS — E669 Obesity, unspecified: Secondary | ICD-10-CM | POA: Insufficient documentation

## 2019-08-28 DIAGNOSIS — F411 Generalized anxiety disorder: Secondary | ICD-10-CM | POA: Insufficient documentation

## 2019-08-28 NOTE — Assessment & Plan Note (Addendum)
Obtain lipid panel. Discussed healthy eating habits and need to exercise. Patient would like to lose weight.  - Encourage healthy eating habits and weight loss

## 2019-08-28 NOTE — Assessment & Plan Note (Addendum)
a1c 5.8.  Will obtain CMP and lipid panel. Discussed need for weight loss and healthy eating habits.   - Reassess in 6 months

## 2019-08-28 NOTE — Assessment & Plan Note (Addendum)
Chronic.  Start ibuprofen 600 mg every 6-8 hours as needed for back pain.  No midline tenderness on exam.  Likely MSK in nature.  Not likely fracture, radiculopathy or spinal mass.  Encouraged patient to get more well fitting bras to support her breasts. Discussed weight loss to decrease breast size and patient reported increasing breast size with recent weight gain.  - Consider muscle relaxer if not improving

## 2019-08-28 NOTE — Assessment & Plan Note (Addendum)
Prescription sent for Kenalog cream 0.1%

## 2019-08-28 NOTE — Assessment & Plan Note (Signed)
GAD7 score: 14 (moderate anxiety).   - Start Cymbalta 60 mg, titrate up  - Patient could benefit from CBT  - Follow up in 4 weeks for medication titration   GAD 7 : Generalized Anxiety Score 08/28/2019  Nervous, Anxious, on Edge 2  Control/stop worrying 2  Worry too much - different things 3  Trouble relaxing 2  Restless 0  Easily annoyed or irritable 3  Afraid - awful might happen 2  Total GAD 7 Score 14  Anxiety Difficulty Very difficult

## 2019-09-18 ENCOUNTER — Other Ambulatory Visit: Payer: Self-pay

## 2019-09-18 ENCOUNTER — Encounter: Payer: Self-pay | Admitting: Family Medicine

## 2019-09-18 ENCOUNTER — Ambulatory Visit (INDEPENDENT_AMBULATORY_CARE_PROVIDER_SITE_OTHER): Payer: Managed Care, Other (non HMO) | Admitting: Family Medicine

## 2019-09-18 VITALS — BP 112/80 | HR 70 | Wt 240.0 lb

## 2019-09-18 DIAGNOSIS — F411 Generalized anxiety disorder: Secondary | ICD-10-CM

## 2019-09-18 DIAGNOSIS — G8929 Other chronic pain: Secondary | ICD-10-CM | POA: Diagnosis not present

## 2019-09-18 DIAGNOSIS — F410 Panic disorder [episodic paroxysmal anxiety] without agoraphobia: Secondary | ICD-10-CM

## 2019-09-18 DIAGNOSIS — M549 Dorsalgia, unspecified: Secondary | ICD-10-CM

## 2019-09-18 MED ORDER — LORAZEPAM 0.5 MG PO TABS: 0.5000 mg | ORAL_TABLET | Freq: Three times a day (TID) | ORAL | 1 refills | Status: AC | PRN

## 2019-09-18 MED ORDER — DICLOFENAC SODIUM 75 MG PO TBEC
75.0000 mg | DELAYED_RELEASE_TABLET | Freq: Two times a day (BID) | ORAL | 0 refills | Status: AC
Start: 2019-09-18 — End: ?

## 2019-09-18 MED ORDER — DULOXETINE HCL 30 MG PO CPEP: 90.0000 mg | ORAL_CAPSULE | Freq: Every day | ORAL | 1 refills | Status: AC

## 2019-09-18 NOTE — Patient Instructions (Signed)
It was great seeing you today!   I'd like to see you back 4 weeks but if you need to be seen earlier than that for any new issues we're happy to fit you in, just give [place] a call!  - Diclofenac twice daily for back pain - Ativan for panic attacks as needed up to 3 times a day  - We increased your Cymbalta to 90 mg (3 tablets) daily  - Continue working on diet and exercise    If you have questions or concerns please do not hesitate to call at [phone].  Dr. [facility]

## 2019-09-20 ENCOUNTER — Encounter: Payer: Self-pay | Admitting: Family Medicine

## 2019-09-20 NOTE — Progress Notes (Signed)
    SUBJECTIVE:   CHIEF COMPLAINT / HPI:   Rose Patterson is a 24 y.o. female here for follow up for anxiety.    Anxiety  Patient reports some improvement in anxiety.  Reports continued panic attacks.  Additionally, her father passed away a few weeks ago  (09/05/19) and believes this has increased her panic episodes. Has been taking Cymbalta daily. Endorses worrying, palpitations, irritability, sweating, and "feeling hot". Has been sleeping better however.    Back pain  Patient brought new bras.  Continues to have sharp mid back pain located underneath her bra line.  Mostly on the right side.  Denies recent trauma.  Ibuprofen helps some.     PERTINENT  PMH / PSH: anxiety, panic attack  OBJECTIVE:   BP 112/80   Pulse 70   Wt 240 lb (108.9 kg)   LMP 09/15/2019 (Exact Date)   Breastfeeding No   BMI 45.35 kg/m    GEN: pleasant well appearing female, in no acute distress  CV: regular rate and rhythm, no murmurs appreciated  RESP: no increased work of breathing, clear to ascultation bilaterally MSK: T spine with no midline tenderness or paraspinal tenderness, no overlying rash or erythema  SKIN: warm, dry  NEURO: grossly normal, moves all extremities appropriately PSYCH: Normal affect and thought content   ASSESSMENT/PLAN:   Back pain Start Diclofen 75 mg BID.  Advised patient not to use other NSAIDs.  Discussed need for weight loss to help with back pain as patient states she recently gained weight and breasts are larger.  Has gotten new supportive bras.  - Consider muscle relaxer if not improving  - Consider referral to PT for core strengthening exercises    GAD (generalized anxiety disorder) GAD 7 : Generalized Anxiety Score 09/18/2019 2019/09/05  Nervous, Anxious, on Edge 0 2  Control/stop worrying 0 2  Worry too much - different things 1 3  Trouble relaxing 1 2  Restless 0 0  Easily annoyed or irritable 2 3  Afraid - awful might happen 1 2  Total GAD 7 Score  5 14  Anxiety Difficulty - Very difficult   Making progress. Will increase Cymbalta to 90 mg daily.  Reassess in 4 weeks. Additionally, patient with increased panic attacks, ?2/2 to grief. Start low dose Ativan 0.5mg  q8h prn (30 tabs ordered).  Do not anticipate patient will need this medication long-term.  Grief counseling ([facility]) message sent to patient via Mychart.       Katha Cabal, DO [facility]

## 2019-09-20 NOTE — Assessment & Plan Note (Signed)
GAD 7 : Generalized Anxiety Score 09/18/2019 08/28/2019  Nervous, Anxious, on Edge 0 2  Control/stop worrying 0 2  Worry too much - different things 1 3  Trouble relaxing 1 2  Restless 0 0  Easily annoyed or irritable 2 3  Afraid - awful might happen 1 2  Total GAD 7 Score 5 14  Anxiety Difficulty - Very difficult   Making progress. Will increase Cymbalta to 90 mg daily.  Reassess in 4 weeks. Additionally, patient with increased panic attacks, ?2/2 to grief. Start low dose Ativan 0.5mg  q8h prn (30 tabs ordered).  Do not anticipate patient will need this medication long-term.  Grief counseling ([facility]) message sent to patient via Mychart.

## 2019-09-20 NOTE — Assessment & Plan Note (Addendum)
Start Diclofen 75 mg BID.  Advised patient not to use other NSAIDs.  Discussed need for weight loss to help with back pain as patient states she recently gained weight and breasts are larger.  Has gotten new supportive bras.  - Consider muscle relaxer if not improving  - Consider referral to PT for core strengthening exercises

## 2019-10-11 NOTE — Assessment & Plan Note (Addendum)
  GAD 7 : Generalized Anxiety Score 10/12/2019 09/18/2019 08/28/2019  Nervous, Anxious, on Edge 1 0 2  Control/stop worrying 1 0 2  Worry too much - different things 2 1 3   Trouble relaxing - 1 2  Restless 0 0 0  Easily annoyed or irritable 1 2 3   Afraid - awful might happen 1 1 2   Total GAD 7 Score - 5 14  Anxiety Difficulty - - Very difficult   Death of father - grief counseling recommended and information for free therapy provided  Making progress. Continue Cymbalta to 90 mg daily.  Reassess in 4 weeks.

## 2019-10-11 NOTE — Assessment & Plan Note (Addendum)
Continue Diclofen 75 mg BID.  Advised patient not to use other NSAIDs.  Discussed need for weight loss to help with back pain as patient states she recently gained weight and breasts are larger.  - Muscle relaxer Rx sent to pharmacy  - Consider referral to PT for core strengthening exercises  - Patient may be candidate for breast reduction

## 2019-10-11 NOTE — Progress Notes (Signed)
    SUBJECTIVE:   CHIEF COMPLAINT / HPI:   Rose Patterson is a 24 y.o. female here for follow up for anxiety.    Anxiety  Patient reports some improvement in anxiety.  Reports deduced number of panic attacks.  Continues to have worrying, palpitations, irritability, sweating, and "feeling hot" and sleep disturbance. Has been sleeping better.  Recently lost her dad in the past 2 months.    Back pain  Taking Diclofenac one daily.  Continues to have sharp mid right sided back pain located underneath her bra line. Worse with bending forward and washing her son.   Denies recent trauma, numbness, weakness, spontaneous loss of bowel or bladder contents or saddle anesthesia.    PERTINENT  PMH / PSH: anxiety, panic attack  OBJECTIVE:   LMP 09/15/2019 (Exact Date)    GEN: well appearing, in no acute distress  CV: regular rate and rhythm, no murmurs appreciated  RESP: no increased work of breathing, clear to ascultation bilaterally  SKIN: warm, dry PSYCH: Normal affect and thought content    ASSESSMENT/PLAN:   Back pain Continue Diclofen 75 mg BID.  Advised patient not to use other NSAIDs.  Discussed need for weight loss to help with back pain as patient states she recently gained weight and breasts are larger.  - Muscle relaxer Rx sent to pharmacy  - Consider referral to PT for core strengthening exercises  - Patient may be candidate for breast reduction    GAD (generalized anxiety disorder)  GAD 7 : Generalized Anxiety Score 10/12/2019 09/18/2019 08/28/2019  Nervous, Anxious, on Edge 1 0 2  Control/stop worrying 1 0 2  Worry too much - different things 2 1 3   Trouble relaxing - 1 2  Restless 0 0 0  Easily annoyed or irritable 1 2 3   Afraid - awful might happen 1 1 2   Total GAD 7 Score - 5 14  Anxiety Difficulty - - Very difficult   Death of father - grief counseling recommended and information for free therapy provided  Making progress. Continue Cymbalta to 90 mg  daily.  Reassess in 4 weeks.       , DO [facility]

## 2019-10-12 ENCOUNTER — Ambulatory Visit (INDEPENDENT_AMBULATORY_CARE_PROVIDER_SITE_OTHER): Payer: Managed Care, Other (non HMO) | Admitting: Family Medicine

## 2019-10-12 ENCOUNTER — Other Ambulatory Visit: Payer: Self-pay

## 2019-10-12 ENCOUNTER — Encounter: Payer: Self-pay | Admitting: Family Medicine

## 2019-10-12 VITALS — BP 102/62 | HR 71 | Ht 61.0 in | Wt 241.2 lb

## 2019-10-12 DIAGNOSIS — R7303 Prediabetes: Secondary | ICD-10-CM

## 2019-10-12 DIAGNOSIS — F411 Generalized anxiety disorder: Secondary | ICD-10-CM

## 2019-10-12 DIAGNOSIS — G8929 Other chronic pain: Secondary | ICD-10-CM

## 2019-10-12 DIAGNOSIS — M549 Dorsalgia, unspecified: Secondary | ICD-10-CM

## 2019-10-12 DIAGNOSIS — Z6841 Body Mass Index (BMI) 40.0 and over, adult: Secondary | ICD-10-CM

## 2019-10-12 MED ORDER — CYCLOBENZAPRINE HCL 5 MG PO TABS: 5.0000 mg | ORAL_TABLET | Freq: Three times a day (TID) | ORAL | 1 refills | Status: AC | PRN

## 2019-10-12 NOTE — Patient Instructions (Signed)
It was great seeing you today!  Please check-out at the front desk before leaving the clinic. I'd like to see you back in 4 weeks but if you need to be seen earlier than that for any new issues we're happy to fit you in, just give [place] a call!  Visit Remembers: - Stop by the pharmacy to pick up your prescriptions  - Continue to work on your healthy eating habits and incorporating exercise into your daily life.  - Medicine Changes: Start Cyclobenzaprine (Flexeril) for back pain.    To Do:  - Remember to take Diclofenac twice a day.  -Call: [facility]  [address] [place], [place], [place] [address] Phone: [phone] -Call  Grief counseling ([facility]) for grief counseling [phone]   Please bring all of your medications with you to each visit.    Feel free to call with any questions or concerns at any time, at [phone].   Take care,  Dr. Katherina Right [facility]

## 2019-10-17 ENCOUNTER — Ambulatory Visit (INDEPENDENT_AMBULATORY_CARE_PROVIDER_SITE_OTHER): Payer: Self-pay | Admitting: Family Medicine

## 2019-10-31 ENCOUNTER — Ambulatory Visit (INDEPENDENT_AMBULATORY_CARE_PROVIDER_SITE_OTHER): Payer: Self-pay | Admitting: Family Medicine

## 2019-11-17 ENCOUNTER — Ambulatory Visit (INDEPENDENT_AMBULATORY_CARE_PROVIDER_SITE_OTHER): Payer: Managed Care, Other (non HMO) | Admitting: Family Medicine

## 2019-11-17 ENCOUNTER — Other Ambulatory Visit: Payer: Self-pay

## 2019-11-17 ENCOUNTER — Encounter: Payer: Self-pay | Admitting: Family Medicine

## 2019-11-17 VITALS — BP 120/70 | HR 84 | Ht 61.0 in | Wt 234.5 lb

## 2019-11-17 DIAGNOSIS — F411 Generalized anxiety disorder: Secondary | ICD-10-CM

## 2019-11-17 DIAGNOSIS — R42 Dizziness and giddiness: Secondary | ICD-10-CM | POA: Diagnosis not present

## 2019-11-17 DIAGNOSIS — Z6841 Body Mass Index (BMI) 40.0 and over, adult: Secondary | ICD-10-CM

## 2019-11-17 DIAGNOSIS — N63 Unspecified lump in unspecified breast: Secondary | ICD-10-CM | POA: Diagnosis not present

## 2019-11-17 NOTE — Progress Notes (Signed)
    SUBJECTIVE:   CHIEF COMPLAINT / HPI:  CC: follow up   Rose Patterson is a 24 y.o. female here for follow-up of her anxiety and obesity.  Anxiety Patient reports decreased panic attacks.  Has been taking her Cymbalta daily.  States that she is used Ativan only 4 times in the past month.  Lump in breast Patient felt a lump in her left breast a couple days ago.  States that the mass was firm and larger than a quarter.  She does not perform self breast exams.Patient's last menstrual period was 10/18/2019.  No family or personal history of breast cancer.  Patient is not on any hormonal birth control.  Dizziness Patient reports she ate at [employer] with her friend two days ago.  Next day she had vomiting and diarrhea for about 5 to 7 hours.  Her friend has similar symptoms.  They ate chicken wraps and fried spinach dip with [employer] egg rolls..  She has had decreased appetite since.  Reports intermittent non-room spinning dizziness when she moves around with certain positions of her head.  Symptoms are improving.  Obesity Patient has lost about 10 pounds.  She has decreased take to about 1200 cal daily.  She has an appointment scheduled with [facility] 11/22/2019.  Of note, she has resumed taking phentermine.   PERTINENT  PMH / PSH: Anxiety, panic disorder, obesity  OBJECTIVE:   BP 120/70   Pulse 84   Ht 5\' 1"  (1.549 m)   Wt 234 lb 8 oz (106.4 kg)   LMP 10/18/2019   SpO2 98%   BMI 44.31 kg/m    GEN: Well-nourished, well-developed not ill-appearing female in no acute distress  CV: regular rate and rhythm, no murmurs appreciated  RESP: no increased work of breathing, clear to ascultation bilaterally BREAST:risk and benefit of breast self-exam was discussed, multiple firm but mobile masses in bilateral breasts.  No axillary or breast lymphadenopathy appreciated. No nipple discharge. No dimpling or skin discoloration of the breast  ABD: Obese abdomen,  nontender MSK: Normal range of motion SKIN: warm, dry Neuro: Cranial nerves II through XII grossly intact, strength 5 out of 5 upper and lower extremities PSYCH: Normal affect and thought content   ASSESSMENT/PLAN:   Obesity Follow-up with [facility] on 11/22/2019. -Advised patient to stop taking phentermine -Advised patient to not restrict certain foods in her diet -Continue exercising as tolerated -Advised patient that restricting her intake could be causing part of her dizziness.  GAD (generalized anxiety disorder) Stable.  Continue current medication.  PHQ-9 6. GAD-7 not completed. -Cymbalta 90 mg daily   Dizziness Likely secondary to restricted intake secondary to her dieting and recent gastroenteritis.  Patient normotensive today.  No dizziness elicited on exam. -Encouraged good oral hydration -Discontinue phentermine -Follow-up if symptoms not improving in 1 to 2 weeks   Lump in female breast Lumps of uncertain etiology. Will obtain breast ultrasound to evaluate.  Patient is around the time of her period so possibly related to menstrual cycle. Unable to appreciate large lump that the patient described.  - Follow up bilateral breast ultrasound      11/24/2019, DO [facility]

## 2019-11-17 NOTE — Patient Instructions (Signed)
It was great seeing you today!   I'd like to see you back in 3 months  but if you need to be seen earlier than that for any new issues we're happy to fit you in, just give [place] a call!  - Follow up with [facility]    If you have questions or concerns please do not hesitate to call at [phone].  Dr. Katherina Right [facility]

## 2019-11-20 DIAGNOSIS — N63 Unspecified lump in unspecified breast: Secondary | ICD-10-CM | POA: Insufficient documentation

## 2019-11-20 DIAGNOSIS — R42 Dizziness and giddiness: Secondary | ICD-10-CM | POA: Insufficient documentation

## 2019-11-20 NOTE — Assessment & Plan Note (Signed)
Follow-up with [facility] on 11/22/2019. -Advised patient to stop taking phentermine -Advised patient to not restrict certain foods in her diet -Continue exercising as tolerated -Advised patient that restricting her intake could be causing part of her dizziness.

## 2019-11-20 NOTE — Assessment & Plan Note (Addendum)
Likely secondary to restricted intake secondary to her dieting and recent gastroenteritis.  Patient normotensive today.  No dizziness elicited on exam. -Encouraged good oral hydration -Discontinue phentermine -Follow-up if symptoms not improving in 1 to 2 weeks

## 2019-11-20 NOTE — Assessment & Plan Note (Addendum)
Lumps of uncertain etiology. Will obtain breast ultrasound to evaluate.  Patient is around the time of her period so possibly related to menstrual cycle. Unable to appreciate large lump that the patient described.  - Follow up bilateral breast ultrasound

## 2019-11-20 NOTE — Assessment & Plan Note (Signed)
Stable.  Continue current medication.  PHQ-9 6. GAD-7 not completed. -Cymbalta 90 mg daily

## 2019-11-22 ENCOUNTER — Other Ambulatory Visit: Payer: Self-pay

## 2019-11-22 ENCOUNTER — Ambulatory Visit (INDEPENDENT_AMBULATORY_CARE_PROVIDER_SITE_OTHER): Payer: Managed Care, Other (non HMO) | Admitting: Family Medicine

## 2019-11-22 ENCOUNTER — Encounter (INDEPENDENT_AMBULATORY_CARE_PROVIDER_SITE_OTHER): Payer: Self-pay | Admitting: Family Medicine

## 2019-11-22 VITALS — BP 115/76 | HR 67 | Temp 98.3°F | Ht 62.0 in | Wt 231.0 lb

## 2019-11-22 DIAGNOSIS — R0602 Shortness of breath: Secondary | ICD-10-CM | POA: Diagnosis not present

## 2019-11-22 DIAGNOSIS — R5383 Other fatigue: Secondary | ICD-10-CM | POA: Diagnosis not present

## 2019-11-22 DIAGNOSIS — Z6841 Body Mass Index (BMI) 40.0 and over, adult: Secondary | ICD-10-CM | POA: Diagnosis not present

## 2019-11-22 DIAGNOSIS — Z0289 Encounter for other administrative examinations: Secondary | ICD-10-CM

## 2019-11-22 DIAGNOSIS — F3289 Other specified depressive episodes: Secondary | ICD-10-CM

## 2019-11-22 DIAGNOSIS — Z8632 Personal history of gestational diabetes: Secondary | ICD-10-CM

## 2019-11-22 DIAGNOSIS — R7303 Prediabetes: Secondary | ICD-10-CM | POA: Diagnosis not present

## 2019-11-22 DIAGNOSIS — Z9189 Other specified personal risk factors, not elsewhere classified: Secondary | ICD-10-CM

## 2019-11-22 NOTE — Progress Notes (Signed)
Dear Dr. Manson Passey,   Thank you for referring Rose Patterson to our clinic. The following note includes my evaluation and treatment recommendations.  Chief Complaint:   OBESITY Rose Patterson (MR# [id number]) is a 24 y.o. female who presents for evaluation and treatment of obesity and related comorbidities. Current BMI is Body mass index is 42.25 kg/m. [name] has been struggling with her weight for many years and has been unsuccessful in either losing weight, maintaining weight loss, or reaching her healthy weight goal.  [name] is currently in the action stage of change and ready to dedicate time achieving and maintaining a healthier weight. [name] is interested in becoming our patient and working on intensive lifestyle modifications including (but not limited to) diet and exercise for weight loss.  [name] says that she skips breakfast 5 days a week or more.  She eats out maybe 4-5 times per week.  She is single with a 67-year-old son at home.  She thinks her metabolism is low and that she needs "a low calorie diet" because of her low metabolism.  She snacks at night (chips, salty, crunchy).  She identifies as eating more fried foods and fast foods than she should.  Portion control is an issue, per patient.  She started losing weight in May 2021.  She has been on an 800 calorie restriction diet, B-HCG, and phentermine as directed by [facility] on [address].  [name]'s habits were reviewed today and are as follows: Her family eats meals together, she thinks her family will eat healthier with her, her desired weight loss is 66 pounds, she has been heavy most of her life, she started gaining weight after pregnancy in 2019, her heaviest weight ever was her current weight, she craves fried foods such as french fries, chicken, etc., she snacks frequently in the evenings, she skips breakfast frequently, she is frequently drinking liquids with calories, she frequently makes poor food  choices, she has problems with excessive hunger, she frequently eats larger portions than normal and she struggles with emotional eating.  Depression Screen [name]'s Food and Mood (modified PHQ-9) score was 12.  Depression screen [facility] 2/9 11/22/2019  Decreased Interest 2  Down, Depressed, Hopeless 2  PHQ - 2 Score 4  Altered sleeping 2  Tired, decreased energy 3  Change in appetite 2  Feeling bad or failure about yourself  0  Trouble concentrating 1  Moving slowly or fidgety/restless 0  Suicidal thoughts 0  PHQ-9 Score 12  Difficult doing work/chores Somewhat difficult   Subjective:   1. Other fatigue [name] admits to daytime somnolence and reports waking up still tired. Patent has a history of symptoms of daytime fatigue, morning fatigue and snoring. [name] generally gets 6 hours of sleep per night, and states that she has poor quality sleep. Snoring is present. Apneic episodes are not present. Epworth Sleepiness Score is 6.  2. Shortness of breath on exertion [name] notes increasing shortness of breath with exercising and seems to be worsening over time with weight gain. She notes getting out of breath sooner with activity than she used to. This has gotten worse recently. [name] denies shortness of breath at rest or orthopnea.  Negative cardiovascular ROS.  No PND, CP, or palpitations.  3. Pre-diabetes [name] has a diagnosis of prediabetes based on her elevated HgA1c and was informed this puts her at greater risk of developing diabetes. She continues to work on diet and exercise to decrease her risk of diabetes. She  denies nausea or hypoglycemia.  [name] was just recently told she has prediabetes.  No medications, but she endorses nighttime hunger.  Lab Results  Component Value Date   HGBA1C 5.8 (H) 08/22/2019   4. History of gestational diabetes [name] has a positive history of prediabetes, which is new.  She had gestational diabetes 2 years ago.  Positive family history of diabetes  mellitus.  5. Other depression, with emotional eating [name] is struggling with emotional eating and using food for comfort to the extent that it is negatively impacting her health. She has been working on behavior modification techniques to help reduce her emotional eating and has been unsuccessful. She shows no sign of suicidal or homicidal ideations.  6. At risk for diabetes mellitus [name] is at higher than average risk for developing diabetes due to positive family history of diabetes.   Assessment/Plan:   1. Other fatigue [name] does feel that her weight is causing her energy to be lower than it should be. Fatigue may be related to obesity, depression or many other causes. Labs will be ordered, and in the meanwhile, [name] will focus on self care including making healthy food choices, increasing physical activity and focusing on stress reduction.  Check labs, prudent nutritional plan, will follow.  As weight decreases, we expect improved physical conditioning.   - EKG 12-Lead - Comprehensive metabolic panel - CBC with Differential/Platelet - Lipid panel - VITAMIN D 25 Hydroxy (Vit-D Deficiency, Fractures) - Vitamin B12 - Folate - T3, free - T4, free - TSH  2. Shortness of breath on exertion [name] does feel that she gets out of breath more easily that she used to when she exercises. [name]'s shortness of breath appears to be obesity related and exercise induced. She has agreed to work on weight loss and gradually increase exercise to treat her exercise induced shortness of breath. Will continue to monitor closely.  Check labs, prudent nutritional plan, will follow.  As weight decreases, we expect improved physical conditioning.   3. Pre-diabetes [name] will continue to work on weight loss, exercise, and decreasing simple carbohydrates to help decrease the risk of diabetes.  Check labs, extensive education done, prudent nutritional plan, weight loss. - Hemoglobin A1c - Insulin,  random  4. History of gestational diabetes Check labs, prudent nutritional plan, weight loss, increase health/wellness.  5. Other depression, with emotional eating Patient was referred to Dr. Dewaine Conger, our Bariatric Psychologist, for evaluation due to her elevated PHQ-9 score and significant struggles with emotional eating.  Continue medications per PCP for now.  Check labs, prudent nutritional plan.  6. At risk for diabetes mellitus Merina was given approximately 15 minutes of diabetes education and counseling today. We discussed intensive lifestyle modifications today with an emphasis on weight loss as well as increasing exercise and decreasing simple carbohydrates in her diet. We also reviewed medication options with an emphasis on risk versus benefit of those discussed.   Repetitive spaced learning was employed today to elicit superior memory formation and behavioral change.  7. Class 3 severe obesity with body mass index (BMI) of 40.0 to 44.9 in adult, unspecified obesity type, unspecified whether serious comorbidity present (HCC) Valeri is currently in the action stage of change and her goal is to continue with weight loss efforts. I recommend Dreanna begin the structured treatment plan as follows:  She has agreed to the Category 2 Plan.  Exercise goals: As is.   Behavioral modification strategies: increasing lean protein intake, increasing water intake, no skipping meals  and planning for success.  She was informed of the importance of frequent follow-up visits to maximize her success with intensive lifestyle modifications for her multiple health conditions. She was informed we would discuss her lab results at her next visit unless there is a critical issue that needs to be addressed sooner. Sharaya agreed to keep her next visit at the agreed upon time to discuss these results.  Objective:   Blood pressure 115/76, pulse 67, temperature 98.3 F (36.8 C), temperature source Oral, height 5\' 2"   (1.575 m), weight 231 lb (104.8 kg), last menstrual period 11/19/2019, SpO2 100 %. Body mass index is 42.25 kg/m.  EKG: Normal sinus rhythm, rate 80 bpm.  Indirect Calorimeter completed today shows a VO2 of 252 and a REE of 1754.  Her calculated basal metabolic rate is 6803 thus her basal metabolic rate is worse than expected.  General: Cooperative, alert, well developed, in no acute distress. HEENT: Conjunctivae and lids unremarkable. Cardiovascular: Regular rhythm.  Lungs: Normal work of breathing. Neurologic: No focal deficits.   Lab Results  Component Value Date   CREATININE 0.74 08/22/2019   BUN 15 08/22/2019   NA 143 08/22/2019   K 4.4 08/22/2019   CL 107 (H) 08/22/2019   CO2 21 08/22/2019   Lab Results  Component Value Date   ALT 22 08/22/2019   AST 22 08/22/2019   ALKPHOS 101 08/22/2019   BILITOT <0.2 08/22/2019   Lab Results  Component Value Date   HGBA1C 5.8 (H) 08/22/2019   Lab Results  Component Value Date   CHOL 129 08/22/2019   HDL 48 08/22/2019   LDLCALC 61 08/22/2019   TRIG 109 08/22/2019   CHOLHDL 2.7 08/22/2019   Lab Results  Component Value Date   WBC 13.8 (H) 06/02/2017   HGB 9.4 (L) 06/02/2017   HCT 27.6 (L) 06/02/2017   MCV 82.6 06/02/2017   PLT 192 06/02/2017   Attestation Statements:   Reviewed by clinician on day of visit: allergies, medications, problem list, medical history, surgical history, family history, social history, and previous encounter notes.  I, Water quality scientist, CMA, am acting as Location manager for [employer], DO.  I have reviewed the above documentation for accuracy and completeness, and I agree with the above. -Mellody Dance, DO
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
## 2019-11-23 LAB — LIPID PANEL
Chol/HDL Ratio: 3.1 ratio (ref 0.0–4.4)
Cholesterol, Total: 111 mg/dL (ref 100–199)
HDL: 36 mg/dL — ABNORMAL LOW (ref >39)
LDL Chol Calc (NIH): 63 mg/dL (ref 0–99)
Triglycerides: 51 mg/dL (ref 0–149)
VLDL Cholesterol Cal: 12 mg/dL (ref 5–40)

## 2019-11-23 LAB — CBC WITH DIFFERENTIAL/PLATELET
Basophils Absolute: 0.1 10*3/uL (ref 0.0–0.2)
Basos: 1 %
EOS (ABSOLUTE): 0.1 10*3/uL (ref 0.0–0.4)
Eos: 2 %
Hematocrit: 37.3 % (ref 34.0–46.6)
Hemoglobin: 12 g/dL (ref 11.1–15.9)
Immature Grans (Abs): 0 10*3/uL (ref 0.0–0.1)
Immature Granulocytes: 0 %
Lymphocytes Absolute: 2.1 10*3/uL (ref 0.7–3.1)
Lymphs: 23 %
MCH: 27.3 pg (ref 26.6–33.0)
MCHC: 32.2 g/dL (ref 31.5–35.7)
MCV: 85 fL (ref 79–97)
Monocytes Absolute: 0.5 10*3/uL (ref 0.1–0.9)
Monocytes: 5 %
Neutrophils Absolute: 6.3 10*3/uL (ref 1.4–7.0)
Neutrophils: 69 %
Platelets: 338 10*3/uL (ref 150–450)
RBC: 4.4 x10E6/uL (ref 3.77–5.28)
RDW: 13.4 % (ref 11.7–15.4)
WBC: 9.1 10*3/uL (ref 3.4–10.8)

## 2019-11-23 LAB — COMPREHENSIVE METABOLIC PANEL
ALT: 18 IU/L (ref 0–32)
AST: 18 IU/L (ref 0–40)
Albumin/Globulin Ratio: 1.9 (ref 1.2–2.2)
Albumin: 4.3 g/dL (ref 3.9–5.0)
Alkaline Phosphatase: 94 IU/L (ref 48–121)
BUN/Creatinine Ratio: 18 (ref 9–23)
BUN: 12 mg/dL (ref 6–20)
Bilirubin Total: 0.3 mg/dL (ref 0.0–1.2)
CO2: 19 mmol/L — ABNORMAL LOW (ref 20–29)
Calcium: 9 mg/dL (ref 8.7–10.2)
Chloride: 105 mmol/L (ref 96–106)
Creatinine, Ser: 0.67 mg/dL (ref 0.57–1.00)
GFR calc Af Amer: 141 mL/min/{1.73_m2} (ref >59)
GFR calc non Af Amer: 123 mL/min/{1.73_m2} (ref >59)
Globulin, Total: 2.3 g/dL (ref 1.5–4.5)
Glucose: 95 mg/dL (ref 65–99)
Potassium: 4 mmol/L (ref 3.5–5.2)
Sodium: 140 mmol/L (ref 134–144)
Total Protein: 6.6 g/dL (ref 6.0–8.5)

## 2019-11-23 LAB — VITAMIN D 25 HYDROXY (VIT D DEFICIENCY, FRACTURES): Vit D, 25-Hydroxy: 12.4 ng/mL — ABNORMAL LOW (ref 30.0–100.0)

## 2019-11-23 LAB — T3, FREE: T3, Free: 2.4 pg/mL (ref 2.0–4.4)

## 2019-11-23 LAB — T4, FREE: Free T4: 0.97 ng/dL (ref 0.82–1.77)

## 2019-11-23 LAB — HEMOGLOBIN A1C
Est. average glucose Bld gHb Est-mCnc: 117 mg/dL
Hgb A1c MFr Bld: 5.7 % — ABNORMAL HIGH (ref 4.8–5.6)

## 2019-11-23 LAB — INSULIN, RANDOM: INSULIN: 9.4 u[IU]/mL (ref 2.6–24.9)

## 2019-11-23 LAB — FOLATE: Folate: 7.1 ng/mL (ref >3.0)

## 2019-11-23 LAB — TSH: TSH: 1.46 u[IU]/mL (ref 0.450–4.500)

## 2019-11-23 LAB — VITAMIN B12: Vitamin B-12: 469 pg/mL (ref 232–1245)

## 2019-11-27 ENCOUNTER — Other Ambulatory Visit: Payer: Self-pay | Admitting: Family Medicine

## 2019-11-27 DIAGNOSIS — N63 Unspecified lump in unspecified breast: Secondary | ICD-10-CM

## 2019-11-29 ENCOUNTER — Telehealth (INDEPENDENT_AMBULATORY_CARE_PROVIDER_SITE_OTHER): Payer: Managed Care, Other (non HMO) | Admitting: Psychology

## 2019-11-29 ENCOUNTER — Encounter (INDEPENDENT_AMBULATORY_CARE_PROVIDER_SITE_OTHER): Payer: Self-pay | Admitting: Family Medicine

## 2019-11-29 ENCOUNTER — Other Ambulatory Visit: Payer: Self-pay

## 2019-11-29 ENCOUNTER — Ambulatory Visit (INDEPENDENT_AMBULATORY_CARE_PROVIDER_SITE_OTHER): Payer: Managed Care, Other (non HMO) | Admitting: Family Medicine

## 2019-11-29 VITALS — BP 144/84 | HR 77 | Temp 98.0°F | Ht 62.0 in | Wt 231.0 lb

## 2019-11-29 DIAGNOSIS — F5089 Other specified eating disorder: Secondary | ICD-10-CM

## 2019-11-29 DIAGNOSIS — R7303 Prediabetes: Secondary | ICD-10-CM

## 2019-11-29 DIAGNOSIS — Z9189 Other specified personal risk factors, not elsewhere classified: Secondary | ICD-10-CM | POA: Diagnosis not present

## 2019-11-29 DIAGNOSIS — E559 Vitamin D deficiency, unspecified: Secondary | ICD-10-CM | POA: Diagnosis not present

## 2019-11-29 DIAGNOSIS — Z6841 Body Mass Index (BMI) 40.0 and over, adult: Secondary | ICD-10-CM

## 2019-11-29 DIAGNOSIS — F419 Anxiety disorder, unspecified: Secondary | ICD-10-CM | POA: Diagnosis not present

## 2019-11-29 DIAGNOSIS — F3289 Other specified depressive episodes: Secondary | ICD-10-CM | POA: Diagnosis not present

## 2019-11-29 MED ORDER — VITAMIN D (ERGOCALCIFEROL) 1.25 MG (50000 UNIT) PO CAPS: 50000.0000 [IU] | ORAL_CAPSULE | ORAL | 0 refills | Status: DC

## 2019-11-29 NOTE — Progress Notes (Signed)
Chief Complaint:   OBESITY Rose Patterson is here to discuss her progress with her obesity treatment plan along with follow-up of her obesity related diagnoses. Rose Patterson is on the Category 2 Plan and states she is following her eating plan approximately 60% of the time. Rose Patterson states she is doing cardio/strength training/weights for 45 minutes 4 times per week.  Today's visit was #: 2 Starting weight: 231 lbs Starting date: 11/22/2019 Today's weight: 231 lbs Today's date: 11/29/2019 Total lbs lost to date: 0 Total lbs lost since last in-office visit: 0  Interim History: Rose Patterson endorses cravings for [place] food.  She says it was difficult for her to get in all the food on the plan.  She is eating half of the protein/calories for some meals.  She says she used the scale a couple of times.  She was scared that if she followed the plan she would gain weight since she was on an 800 calorie diet prior.  Subjective:   1. Pre-diabetes Rose Patterson has a diagnosis of prediabetes based on her elevated HgA1c and was informed this puts her at greater risk of developing diabetes. She continues to work on diet and exercise to decrease her risk of diabetes. She denies nausea or hypoglycemia.  A1c was 5.8 on 08/22/2019, now is 5.7.  Elevated fasting insulin.  Lab Results  Component Value Date   HGBA1C 5.7 (H) 11/22/2019   Lab Results  Component Value Date   INSULIN 9.4 11/22/2019   2. Vitamin D deficiency Rose Patterson Vitamin D level was 12.4 on 11/22/2019. She is currently taking no vitamin D supplement. She denies nausea, vomiting or muscle weakness.  3. Other depression, with emotional eating Rose Patterson is struggling with emotional eating and using food for comfort to the extent that it is negatively impacting her health. She has been working on behavior modification techniques to help reduce her emotional eating and has been unsuccessful. She shows no sign of suicidal or homicidal ideations.    4. At risk for  hyperglycemia Rose Patterson is at risk for hyperglycemia due to her diagnosis of prediabetes with elevated A1c and fasting insulin.  Assessment/Plan:   1. Pre-diabetes Rose Patterson will continue to work on weight loss, exercise, and decreasing simple carbohydrates to help decrease the risk of diabetes.   2. Vitamin D deficiency Low Vitamin D level contributes to fatigue and are associated with obesity, breast, and colon cancer. She agrees to start to take prescription Vitamin D @50 ,000 IU every week and will follow-up for routine testing of Vitamin D, at least 2-3 times per year to avoid over-replacement. Will recheck vitamin D level in 3 months. - Vitamin D, Ergocalciferol, (DRISDOL) 1.25 MG (50000 UNIT) CAPS capsule; Take 1 capsule (50,000 Units total) by mouth every 7 (seven) days.  Dispense: 4 capsule; Refill: 0  3. Other depression, with emotional eating Behavior modification techniques were discussed today to help Rose Patterson deal with her emotional/non-hunger eating behaviors.  Orders and follow up as documented in patient record.  She has an appointment with Dr. Janine Limbo.  4. At risk for hyperglycemia Rose Patterson was given approximately 15 minutes of counseling today regarding prevention of hyperglycemia. She was advised of hyperglycemia causes and the fact hyperglycemia is often asymptomatic. Rose Patterson was instructed to avoid skipping meals, eat regular protein rich meals and schedule low calorie but protein rich snacks as needed.   Repetitive spaced learning was employed today to elicit superior memory formation and behavioral change  5. Class 3 severe obesity with body mass  index (BMI) of 40.0 to 44.9 in adult, unspecified obesity type, unspecified whether serious comorbidity present (HCC) Rose Patterson is currently in the action stage of change. As such, her goal is to continue with weight loss efforts. She has agreed to the Category 2 Plan.   Exercise goals: As is.  Behavioral modification strategies: increasing lean  protein intake, decreasing simple carbohydrates, decreasing alcohol intake, no skipping meals and celebration eating strategies.  Rose Patterson has agreed to follow-up with our clinic in 2 weeks. She was informed of the importance of frequent follow-up visits to maximize her success with intensive lifestyle modifications for her multiple health conditions.   Objective:   Blood pressure (!) 144/84, pulse 77, temperature 98 F (36.7 C), temperature source Oral, height 5\' 2"  (1.575 m), weight 231 lb (104.8 kg), last menstrual period 11/19/2019, SpO2 98 %. Body mass index is 42.25 kg/m.  General: Cooperative, alert, well developed, in no acute distress. HEENT: Conjunctivae and lids unremarkable. Cardiovascular: Regular rhythm.  Lungs: Normal work of breathing. Neurologic: No focal deficits.   Lab Results  Component Value Date   CREATININE 0.67 11/22/2019   BUN 12 11/22/2019   NA 140 11/22/2019   K 4.0 11/22/2019   CL 105 11/22/2019   CO2 19 (L) 11/22/2019   Lab Results  Component Value Date   ALT 18 11/22/2019   AST 18 11/22/2019   ALKPHOS 94 11/22/2019   BILITOT 0.3 11/22/2019   Lab Results  Component Value Date   HGBA1C 5.7 (H) 11/22/2019   HGBA1C 5.8 (H) 08/22/2019   Lab Results  Component Value Date   INSULIN 9.4 11/22/2019   Lab Results  Component Value Date   TSH 1.460 11/22/2019   Lab Results  Component Value Date   CHOL 111 11/22/2019   HDL 36 (L) 11/22/2019   LDLCALC 63 11/22/2019   TRIG 51 11/22/2019   CHOLHDL 3.1 11/22/2019   Lab Results  Component Value Date   WBC 9.1 11/22/2019   HGB 12.0 11/22/2019   HCT 37.3 11/22/2019   MCV 85 11/22/2019   PLT 338 11/22/2019   Attestation Statements:   Reviewed by clinician on day of visit: allergies, medications, problem list, medical history, surgical history, family history, social history, and previous encounter notes.  I, 11/24/2019, CMA, am acting as Insurance claims handler for Energy manager, DO.  I have  reviewed the above documentation for accuracy and completeness, and I agree with the above. [employer], DO

## 2019-12-06 ENCOUNTER — Ambulatory Visit (INDEPENDENT_AMBULATORY_CARE_PROVIDER_SITE_OTHER): Payer: Managed Care, Other (non HMO) | Admitting: Family Medicine

## 2019-12-06 ENCOUNTER — Other Ambulatory Visit: Payer: Self-pay

## 2019-12-06 ENCOUNTER — Encounter (INDEPENDENT_AMBULATORY_CARE_PROVIDER_SITE_OTHER): Payer: Self-pay | Admitting: Family Medicine

## 2019-12-06 ENCOUNTER — Ambulatory Visit (INDEPENDENT_AMBULATORY_CARE_PROVIDER_SITE_OTHER): Payer: Self-pay | Admitting: Family Medicine

## 2019-12-06 VITALS — BP 125/84 | HR 74 | Temp 97.5°F | Ht 62.0 in | Wt 232.0 lb

## 2019-12-06 DIAGNOSIS — Z6841 Body Mass Index (BMI) 40.0 and over, adult: Secondary | ICD-10-CM

## 2019-12-06 DIAGNOSIS — R7303 Prediabetes: Secondary | ICD-10-CM

## 2019-12-06 DIAGNOSIS — F3289 Other specified depressive episodes: Secondary | ICD-10-CM | POA: Diagnosis not present

## 2019-12-06 DIAGNOSIS — E559 Vitamin D deficiency, unspecified: Secondary | ICD-10-CM

## 2019-12-07 NOTE — Progress Notes (Signed)
Chief Complaint:   OBESITY Rose Patterson is here to discuss her progress with her obesity treatment plan along with follow-up of her obesity related diagnoses. Rose Patterson is on the Category 2 Plan and states she is following her eating plan approximately 60% of the time. Rose Patterson states she is doing cardio for 30 minutes 2 times per week.  Today's visit was #: 3 Starting weight: 231 lbs Starting date: 11/22/2019 Today's weight: 232 lbs Today's date: 12/06/2019 Total lbs lost to date: 0 Total lbs lost since last in-office visit: 0  Interim History: Rose Patterson says that she went out with "the girls" recently.  She drank much more than usual this weekend, but had a good time.  When she follows the plan, her hunger is well-controlled and cravings are well-controlled.  Over 4th of July weekend, she says she did not eat very healthy.  Subjective:   1. Pre-diabetes Donetta has a diagnosis of prediabetes based on her elevated HgA1c and was informed this puts her at greater risk of developing diabetes. She continues to work on diet and exercise to decrease her risk of diabetes. She denies nausea or hypoglycemia.  Lab Results  Component Value Date   HGBA1C 5.7 (H) 11/22/2019   Lab Results  Component Value Date   INSULIN 9.4 11/22/2019   2. Vitamin D deficiency Tyanna's Vitamin D level was 12.4 on 11/22/2019. She was started on prescription vitamin D 50,000 IU each week at last visit. She has not picked it up.  3. Other depression, with emotional eating Rose Patterson saw Dr. Dewaine Conger once and has a follow-up in the near future.  She found it helpful.  She has emotional eating and stress eating and celebration eating to excess to increase pleasure.  Assessment/Plan:   1. Pre-diabetes Rose Patterson will continue to work on weight loss, exercise, and decreasing simple carbohydrates to help decrease the risk of diabetes.  Continue prudent nutritional plan and weight loss.  2. Vitamin D deficiency Low Vitamin D level contributes  to fatigue and are associated with obesity, breast, and colon cancer. She agrees to continue to take prescription Vitamin D @50 ,000 IU every week and will follow-up for routine testing of Vitamin D, at least 2-3 times per year to avoid over-replacement.  3. Other depression, with emotional eating Behavior modification techniques were discussed today to help Rose Patterson deal with her emotional/non-hunger eating behaviors.  Orders and follow up as documented in patient record.   4. Class 3 severe obesity with body mass index (BMI) of 40.0 to 44.9 in adult, unspecified obesity type, unspecified whether serious comorbidity present (HCC) Rose Patterson is currently in the action stage of change. As such, her goal is to continue with weight loss efforts. She has agreed to the Category 2 Plan.   Exercise goals: As is.  Behavioral modification strategies: increasing lean protein intake, decreasing simple carbohydrates, increasing water intake, meal planning and cooking strategies and decreasing junk food.  Rose Patterson has agreed to follow-up with our clinic in 2 weeks. She was informed of the importance of frequent follow-up visits to maximize her success with intensive lifestyle modifications for her multiple health conditions.   Objective:   Blood pressure 125/84, pulse 74, temperature (!) 97.5 F (36.4 C), temperature source Oral, height 5\' 2"  (1.575 m), weight 232 lb (105.2 kg), last menstrual period 11/19/2019, SpO2 99 %. Body mass index is 42.43 kg/m.  General: Cooperative, alert, well developed, in no acute distress. HEENT: Conjunctivae and lids unremarkable. Cardiovascular: Regular rhythm.  Lungs: Normal  work of breathing. Neurologic: No focal deficits.   Lab Results  Component Value Date   CREATININE 0.67 11/22/2019   BUN 12 11/22/2019   NA 140 11/22/2019   K 4.0 11/22/2019   CL 105 11/22/2019   CO2 19 (L) 11/22/2019   Lab Results  Component Value Date   ALT 18 11/22/2019   AST 18 11/22/2019    ALKPHOS 94 11/22/2019   BILITOT 0.3 11/22/2019   Lab Results  Component Value Date   HGBA1C 5.7 (H) 11/22/2019   HGBA1C 5.8 (H) 08/22/2019   Lab Results  Component Value Date   INSULIN 9.4 11/22/2019   Lab Results  Component Value Date   TSH 1.460 11/22/2019   Lab Results  Component Value Date   CHOL 111 11/22/2019   HDL 36 (L) 11/22/2019   LDLCALC 63 11/22/2019   TRIG 51 11/22/2019   CHOLHDL 3.1 11/22/2019   Lab Results  Component Value Date   WBC 9.1 11/22/2019   HGB 12.0 11/22/2019   HCT 37.3 11/22/2019   MCV 85 11/22/2019   PLT 338 11/22/2019   Attestation Statements:   Reviewed by clinician on day of visit: allergies, medications, problem list, medical history, surgical history, family history, social history, and previous encounter notes.  Time spent on visit including pre-visit chart review and post-visit care and charting was 25 minutes.   I, Insurance claims handler, CMA, am acting as Energy manager for [employer], DO.  I have reviewed the above documentation for accuracy and completeness, and I agree with the above. Thomasene Lot, DO

## 2019-12-12 ENCOUNTER — Other Ambulatory Visit: Payer: Managed Care, Other (non HMO)

## 2019-12-12 ENCOUNTER — Inpatient Hospital Stay: Admission: RE | Admit: 2019-12-12 | Payer: Managed Care, Other (non HMO) | Source: Ambulatory Visit

## 2019-12-18 ENCOUNTER — Other Ambulatory Visit: Payer: Self-pay

## 2019-12-18 ENCOUNTER — Telehealth (INDEPENDENT_AMBULATORY_CARE_PROVIDER_SITE_OTHER): Payer: 59 | Admitting: Psychology

## 2019-12-18 DIAGNOSIS — F5089 Other specified eating disorder: Secondary | ICD-10-CM

## 2019-12-18 DIAGNOSIS — F419 Anxiety disorder, unspecified: Secondary | ICD-10-CM | POA: Diagnosis not present

## 2019-12-18 NOTE — Progress Notes (Signed)
  Office: [phone]  /  Fax: [phone]    Date: December 18, 2019    Appointment Start Time: 10:35am Duration: 22 minutes Provider: Lawerance Cruel, Psy.D. Type of Session: Individual Therapy  Location of Patient: Parked in car outside [facility] in [place] Location of Provider: [facility] Type of Contact: Telepsychological Visit via MyChart Video Visit  Session Content: This provider called Rose Patterson at 10:32am as she did not present for the telepsychological appointment. She stated she forgot about today's appointment, but noted she could join. As such, today's appointment was initiated 5 minutes late. Rose Patterson is a 24 y.o. female presenting via MyChart Video Visit for a follow-up appointment to address the previously established treatment goal of increasing coping skills. Today's appointment was a telepsychological visit due to COVID-19. Rose Patterson provided verbal consent for today's telepsychological appointment and she is aware she is responsible for securing confidentiality on her end of the session. Prior to proceeding with today's appointment, Rose Patterson's physical location at the time of this appointment was obtained as well a phone number she could be reached at in the event of technical difficulties. Rose Patterson and this provider participated in today's telepsychological service.   This provider conducted a brief check-in. Rose Patterson stated she has been working and preparing for an upcoming trip. Regarding eating, she noted, "It's been going pretty good." Emotional and physical hunger were reviewed. Psychoeducation regarding triggers for emotional eating was provided. Rose Patterson was provided a handout, and encouraged to utilize the handout between now and the next appointment to increase awareness of triggers and frequency. Rose Patterson agreed. This provider also discussed behavioral strategies for specific triggers, such as placing the utensil down when conversing to avoid mindless eating. Rose Patterson provided verbal  consent during today's appointment for this provider to send a handout about triggers via e-mail. Rose Patterson was receptive to today's appointment as evidenced by openness to sharing, responsiveness to feedback, and willingness to explore triggers for emotional eating.  Mental Status Examination:  Appearance: well groomed and appropriate hygiene  Behavior: appropriate to circumstances Mood: euthymic Affect: mood congruent Speech: normal in rate, volume, and tone Eye Contact: appropriate Psychomotor Activity: appropriate Gait: unable to assess Thought Process: linear, logical, and goal directed  Thought Content/Perception: no hallucinations, delusions, bizarre thinking or behavior reported or observed and no evidence of suicidal and homicidal ideation, plan, and intent Orientation: time, person, place, and purpose of appointment Memory/Concentration: memory, attention, language, and fund of knowledge intact  Insight/Judgment: good  Interventions:  Conducted a brief chart review Provided empathic reflections and validation Reviewed content from the previous session Employed supportive psychotherapy interventions to facilitate reduced distress and to improve coping skills with identified stressors Psychoeducation provided regarding triggers for emotional eating  DSM-5 Diagnosis(es): 307.59 (F50.8) Other Specified Feeding or Eating Disorder, Emotional Eating Behaviors and 300.00 (F41.9) Unspecified Anxiety Disorder  Treatment Goal & Progress: During the initial appointment with this provider, the following treatment goal was established: increase coping skills. Rose Patterson has demonstrated progress in her goal as evidenced by increased awareness of hunger patterns.   Plan: Based on Rose Patterson's schedule and appointment availability, the next appointment will be scheduled in three weeks, which will be via MyChart Video Visit. The next session will focus on working towards the established treatment goal.

## 2019-12-25 ENCOUNTER — Ambulatory Visit (INDEPENDENT_AMBULATORY_CARE_PROVIDER_SITE_OTHER): Payer: Managed Care, Other (non HMO) | Admitting: Family Medicine

## 2019-12-25 ENCOUNTER — Encounter (INDEPENDENT_AMBULATORY_CARE_PROVIDER_SITE_OTHER): Payer: Self-pay | Admitting: Family Medicine

## 2019-12-25 ENCOUNTER — Other Ambulatory Visit: Payer: Self-pay

## 2019-12-25 VITALS — BP 124/81 | HR 94 | Temp 98.3°F | Ht 62.0 in | Wt 239.0 lb

## 2019-12-25 DIAGNOSIS — R7303 Prediabetes: Secondary | ICD-10-CM

## 2019-12-25 DIAGNOSIS — Z9189 Other specified personal risk factors, not elsewhere classified: Secondary | ICD-10-CM

## 2019-12-25 DIAGNOSIS — E559 Vitamin D deficiency, unspecified: Secondary | ICD-10-CM

## 2019-12-25 DIAGNOSIS — Z6841 Body Mass Index (BMI) 40.0 and over, adult: Secondary | ICD-10-CM

## 2019-12-25 MED ORDER — VITAMIN D (ERGOCALCIFEROL) 1.25 MG (50000 UNIT) PO CAPS: 50000.0000 [IU] | ORAL_CAPSULE | ORAL | 0 refills | Status: DC

## 2019-12-25 NOTE — Progress Notes (Signed)
Chief Complaint:   OBESITY Rose Patterson is here to discuss her progress with her obesity treatment plan along with follow-up of her obesity related diagnoses. Rose Patterson is on the Category 2 Plan and states she is following her eating plan approximately 0% of the time. Rose Patterson states she is doing cardio for 45 minutes 3 times per week.  Today's visit was #: 4 Starting weight: 231 lbs Starting date: 11/22/2019 Today's weight: 239 lbs Today's date: 12/25/2019 Total lbs lost to date: 0 Total lbs lost since last in-office visit: 0  Interim History: Rose Patterson was last seen by me for her third office visit on 12/06/2019.  Her birthday was 12/22/2021.  She went to [place] for her [place] and ate and drank a lot.  She has no upcoming travel or celebrations.  Subjective:   1. Pre-diabetes Rose Patterson has a diagnosis of prediabetes based on her elevated HgA1c and was informed this puts her at greater risk of developing diabetes. She continues to work on diet and exercise to decrease her risk of diabetes. She denies nausea or hypoglycemia.  A1c was 5.8 four months ago.  Lab Results  Component Value Date   HGBA1C 5.7 (H) 11/22/2019   Lab Results  Component Value Date   INSULIN 9.4 11/22/2019   2. Vitamin D deficiency Rose Patterson's Vitamin D level was 12.4 on 11/22/2019. She is currently taking prescription vitamin D 50,000 IU each week. She denies nausea, vomiting or muscle weakness.  3. At risk for diabetes mellitus Rose Patterson is at higher than average risk for developing diabetes due to her obesity.   Assessment/Plan:   1. Pre-diabetes Rose Patterson will continue to work on weight loss, exercise, and decreasing simple carbohydrates to help decrease the risk of diabetes.   2. Vitamin D deficiency Low Vitamin D level contributes to fatigue and are associated with obesity, breast, and colon cancer. She agrees to continue to take prescription Vitamin D @50 ,000 IU every week and will follow-up for routine testing of Vitamin D,  at least 2-3 times per year to avoid over-replacement. - Vitamin D, Ergocalciferol, (DRISDOL) 1.25 MG (50000 UNIT) CAPS capsule; Take 1 capsule (50,000 Units total) by mouth every 7 (seven) days.  Dispense: 4 capsule; Refill: 0  3. At risk for diabetes mellitus Rose Patterson was given approximately 15 minutes of diabetes education and counseling today. We discussed intensive lifestyle modifications today with an emphasis on weight loss as well as increasing exercise and decreasing simple carbohydrates in her diet. We also reviewed medication options with an emphasis on risk versus benefit of those discussed.   Repetitive spaced learning was employed today to elicit superior memory formation and behavioral change.  4. Class 3 severe obesity with serious comorbidity and body mass index (BMI) of 40.0 to 44.9 in adult, unspecified obesity type (Rose Patterson) Rose Patterson is currently in the action stage of change. As such, her goal is to continue with weight loss efforts. She has agreed to the Category 2 Plan.   Exercise goals: As is.  Behavioral modification strategies: increasing lean protein intake, increasing water intake, no skipping meals, meal planning and cooking strategies and planning for success.  Goal is to follow the plan 80% of the time.  She needs to meal prep!  Prepackage meals/ounces of meat for the day.  Rose Patterson has agreed to follow-up with our clinic in 2 weeks. She was informed of the importance of frequent follow-up visits to maximize her success with intensive lifestyle modifications for her multiple health conditions.  Objective:   Blood pressure 124/81, pulse 94, temperature 98.3 F (36.8 C), height 5\' 2"  (1.575 m), weight (!) 239 lb (108.4 kg), last menstrual period 11/19/2019, SpO2 100 %. Body mass index is 43.71 kg/m.  General: Cooperative, alert, well developed, in no acute distress. HEENT: Conjunctivae and lids unremarkable. Cardiovascular: Regular rhythm.  Lungs: Normal work of  breathing. Neurologic: No focal deficits.   Lab Results  Component Value Date   CREATININE 0.67 11/22/2019   BUN 12 11/22/2019   NA 140 11/22/2019   K 4.0 11/22/2019   CL 105 11/22/2019   CO2 19 (L) 11/22/2019   Lab Results  Component Value Date   ALT 18 11/22/2019   AST 18 11/22/2019   ALKPHOS 94 11/22/2019   BILITOT 0.3 11/22/2019   Lab Results  Component Value Date   HGBA1C 5.7 (H) 11/22/2019   HGBA1C 5.8 (H) 08/22/2019   Lab Results  Component Value Date   INSULIN 9.4 11/22/2019   Lab Results  Component Value Date   TSH 1.460 11/22/2019   Lab Results  Component Value Date   CHOL 111 11/22/2019   HDL 36 (L) 11/22/2019   LDLCALC 63 11/22/2019   TRIG 51 11/22/2019   CHOLHDL 3.1 11/22/2019   Lab Results  Component Value Date   WBC 9.1 11/22/2019   HGB 12.0 11/22/2019   HCT 37.3 11/22/2019   MCV 85 11/22/2019   PLT 338 11/22/2019   Attestation Statements:   Reviewed by clinician on day of visit: allergies, medications, problem list, medical history, surgical history, family history, social history, and previous encounter notes.  I, 11/24/2019, CMA, am acting as Insurance claims handler for Energy manager, DO.  I have reviewed the above documentation for accuracy and completeness, and I agree with the above. [employer], DO

## 2019-12-25 NOTE — Progress Notes (Unsigned)
Office: [phone]  /  Fax: [phone]    Date: January 08, 2020   Appointment Start Time: *** Duration: *** minutes Provider: Lawerance Cruel, Psy.D. Type of Session: Individual Therapy  Location of Patient: {gbptloc:23249} Location of Provider: [facility] Type of Contact: Telepsychological Visit via MyChart Video Visit  Session Content: This provider called Rose Patterson at 8:32am as she did not present for the telepsychological appointment. A voicemail could not be left as Rose Patterson's mailbox was full. As such, today's appointment was initiated *** minutes late.  Rose Patterson is a 24 y.o. female presenting via MyChart Video Visit for a follow-up appointment to address the previously established treatment goal of increasing coping skills. Today's appointment was a telepsychological visit due to COVID-19. Rose Patterson provided verbal consent for today's telepsychological appointment and she is aware she is responsible for securing confidentiality on her end of the session. Prior to proceeding with today's appointment, Rose Patterson's physical location at the time of this appointment was obtained as well a phone number she could be reached at in the event of technical difficulties. Rose Patterson and this provider participated in today's telepsychological service.   This provider conducted a brief check-in and verbally administered the PHQ-9 and GAD-7. *** Psychoeducation regarding mindfulness was provided. A handout was provided to Rose Patterson, Rose Patterson with further information regarding mindfulness, including exercises. This provider also explained the benefit of mindfulness as it relates to emotional eating. Rose Patterson was encouraged to engage in the provided exercises between now and the next appointment with this provider. Rose Patterson agreed. During today's appointment, Rose Patterson was led through a mindfulness exercise involving her senses. Rose Patterson provided verbal consent during today's appointment for this provider to send a handout about  mindfulness via e-mail. Rose Patterson was receptive to today's appointment as evidenced by openness to sharing, responsiveness to feedback, and {gbreceptiveness:23401}.  Mental Status Examination:  Appearance: {Appearance:22431} Behavior: {Behavior:22445} Mood: {gbmood:21757} Affect: {Affect:22436} Speech: {Speech:22432} Eye Contact: {Eye Contact:22433} Psychomotor Activity: {Motor Activity:22434} Gait: {gbgait:23404} Thought Process: {thought process:22448}  Thought Content/Perception: {disturbances:22451} Orientation: {Orientation:22437} Memory/Concentration: {gbcognition:22449} Insight/Judgment: {Insight:22446}  Structured Assessments Results: The Patient Health Questionnaire-9 (PHQ-9) is a self-report measure that assesses symptoms and severity of depression over the course of the last two weeks. Rose Patterson obtained a score of *** suggesting {GBPHQ9SEVERITY:21752}. Rose Patterson finds the endorsed symptoms to be {gbphq9difficulty:21754}. [0= Not at all; 1= Several days; 2= More than half the days; 3= Nearly every day] Little interest or pleasure in doing things ***  Feeling down, depressed, or hopeless ***  Trouble falling or staying asleep, or sleeping too much ***  Feeling tired or having little energy ***  Poor appetite or overeating ***  Feeling bad about yourself --- or that you are a failure or have let yourself or your family down ***  Trouble concentrating on things, such as reading the newspaper or watching television ***  Moving or speaking so slowly that other people could have noticed? Or the opposite --- being so fidgety or restless that you have been moving around a lot more than usual ***  Thoughts that you would be better off dead or hurting yourself in some way ***  PHQ-9 Score ***    The Generalized Anxiety Disorder-7 (GAD-7) is a brief self-report measure that assesses symptoms of anxiety over the course of the last two weeks. Rose Patterson obtained a score of *** suggesting  {gbgad7severity:21753}. Rose Patterson finds the endorsed symptoms to be {gbphq9difficulty:21754}. [0= Not at all; 1= Several days; 2= Over half the days; 3= Nearly every day] Feeling nervous,  anxious, on edge ***  Not being able to stop or control worrying ***  Worrying too much about different things ***  Trouble relaxing ***  Being so restless that it's hard to sit still ***  Becoming easily annoyed or irritable ***  Feeling afraid as if something awful might happen ***  GAD-7 Score ***   Interventions:  {Interventions for Progress Notes:23405}  DSM-5 Diagnosis(es): 307.59 (F50.8) Other Specified Feeding or Eating Disorder, Emotional Eating Behaviors and 300.00 (F41.9) Unspecified Anxiety Disorder  Treatment Goal & Progress: During the initial appointment with this provider, the following treatment goal was established: increase coping skills. Rose Patterson has demonstrated progress in her goal as evidenced by {gbtxprogress:22839}. Rose Patterson also {gbtxprogress2:22951}.  Plan: The next appointment will be scheduled in {gbweeks:21758}, which will be {gbtxmodality:23402}. The next session will focus on {Plan for Next Appointment:23400}.

## 2020-01-08 ENCOUNTER — Ambulatory Visit (INDEPENDENT_AMBULATORY_CARE_PROVIDER_SITE_OTHER): Payer: Managed Care, Other (non HMO) | Admitting: Family Medicine

## 2020-01-08 ENCOUNTER — Other Ambulatory Visit: Payer: Self-pay

## 2020-01-08 ENCOUNTER — Encounter: Payer: Self-pay | Admitting: Family Medicine

## 2020-01-08 ENCOUNTER — Encounter (INDEPENDENT_AMBULATORY_CARE_PROVIDER_SITE_OTHER): Payer: Self-pay | Admitting: Family Medicine

## 2020-01-08 ENCOUNTER — Telehealth (INDEPENDENT_AMBULATORY_CARE_PROVIDER_SITE_OTHER): Payer: Self-pay | Admitting: Psychology

## 2020-01-08 ENCOUNTER — Telehealth (INDEPENDENT_AMBULATORY_CARE_PROVIDER_SITE_OTHER): Payer: 59 | Admitting: Psychology

## 2020-01-08 VITALS — BP 137/83 | HR 86 | Temp 97.9°F | Ht 62.0 in | Wt 220.0 lb

## 2020-01-08 VITALS — BP 104/62 | HR 82 | Wt 223.6 lb

## 2020-01-08 DIAGNOSIS — Z6841 Body Mass Index (BMI) 40.0 and over, adult: Secondary | ICD-10-CM

## 2020-01-08 DIAGNOSIS — R7303 Prediabetes: Secondary | ICD-10-CM | POA: Diagnosis not present

## 2020-01-08 DIAGNOSIS — E786 Lipoprotein deficiency: Secondary | ICD-10-CM

## 2020-01-08 DIAGNOSIS — R197 Diarrhea, unspecified: Secondary | ICD-10-CM

## 2020-01-08 DIAGNOSIS — E559 Vitamin D deficiency, unspecified: Secondary | ICD-10-CM | POA: Diagnosis not present

## 2020-01-08 MED ORDER — VITAMIN D (ERGOCALCIFEROL) 1.25 MG (50000 UNIT) PO CAPS: 50000.0000 [IU] | ORAL_CAPSULE | ORAL | 0 refills | Status: AC

## 2020-01-08 MED ORDER — LOPERAMIDE HCL 2 MG PO TABS: 2.0000 mg | ORAL_TABLET | Freq: Four times a day (QID) | ORAL | 0 refills | Status: AC | PRN

## 2020-01-08 NOTE — Patient Instructions (Signed)
It was wonderful to see you today.   Biggest thing is to make sure that you maintain your hydration.  Please take small sips of room temperature water.  You can also use Gatorade, diluted apple juice, or ginger ale to help.  Try to eat things that will be gentle on your stomach.  Additionally I have sent in Imodium, you can take this whenever you have a loose stools to help reduce the amount.  Additionally, I recommended you get Covid tested just to make sure this is not an atypical presentation of the new variant.  Please make sure that you are wearing her mask and frequently washing her hands around the house.  Please follow-up if not improving the next 1-2 weeks or sooner if any development of fever, blood in your stool, lightheadedness/dizziness with inability to maintain your hydration, or severe abdominal pain.

## 2020-01-08 NOTE — Progress Notes (Signed)
    SUBJECTIVE:   CHIEF COMPLAINT / HPI: Diarrhea   Rose Patterson is a 24 year old female presenting for evaluation of diarrhea and headache.  Reports symptoms initially started this past Thursday, 8/5, with a right temporal pulsating headache after going out to dinner and drinks with friends.  This resolved by Friday, but then started with watery brown diarrhea through today.  Has had several episodes each day, has turned yellow.  Of note, her son who is in daycare and 38 years old, also had a several day bout of diarrhea with 1 episode of vomiting just last week.  No associated fever, abdominal pain (other than some cramping right before), nausea, vomiting, lightheadedness/dizziness, presyncopal symptoms, melena, or hematochezia.  Headache comes and goes, better with Tylenol, no visual changes.  No change in taste/smell, myalgias.  Recently traveled to [place] a few weeks ago but has felt fine up until this weekend.  No new foods, sick contacts, or laxatives.  No known history of IBD.  She has been eating minimally and drinking some fluids.  Has not been vaccinated for Covid.   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  PERTINENT  PMH / PSH: Prediabetes, tobacco use, elevated BMI of 40, anxiety  OBJECTIVE:   BP 104/62   Pulse 82   Wt 223 lb 9.6 oz (101.4 kg)   LMP 12/28/2019 (Exact Date)   SpO2 98%   BMI 40.90 kg/m   General: Alert, NAD, sitting comfortably HEENT: NCAT, MMM Lungs: No increased WOB  Abdomen: soft, non-tender, non-distended, normoactive BS in all 4 quadrants Neuro: Alert and oriented.  Speech easily understandable and follows conversation.  Can follow commands.  CN II-XII intact.  EOMI, PERRLA.  Able to move all extremities spontaneously with 5/5 upper and lower extremity strength bilaterally. Ext: Warm, dry, 2+ distal pulses radially and posterior tibialis bilaterally  ASSESSMENT/PLAN:   Acute diarrhea 3-day history, associated with intermittent headache  and son with recent diarrheal illness now resolved.  Reassuringly appears well-hydrated with benign abdominal and neurologic exam.  Suspect is likely a viral GI illness, however given recent rise of atypical Covid delta variant with GI symptoms, recommended Covid testing.  No alarm s/sx (fever, hematochezia etc) to suggest bacterial source at this time.  Encouraged maintaining hydration and using Imodium as needed.     ED/return precautions discussed including if not improving the next 1-2 weeks or sooner if development of fever, presyncopal symptoms with inability to maintain hydration, severe abdominal pain, or any melena/hematochezia.  Work note provided to stay out through 8/11 while awaiting testing and resolution.  Should discuss Covid vaccination on follow-up.  Allayne Stack, DO  [facility]

## 2020-01-08 NOTE — Telephone Encounter (Signed)
  Office: [phone]  /  Fax: [phone]  Date of Call: January 08, 2020  Time of Call: 8:32am Provider: Lawerance Cruel, PsyD  CONTENT:  This provider called Sashia to check-in as she did not present for today's MyChart Video Visit appointment at 8:30am. A HIPAA compliant voicemail could not be left as Niyati's mailbox was full. Of note, this provider stayed on the MyChart Video Visit appointment for 5 minutes prior to signing off per the clinic's grace period policy.    PLAN: This provider will wait for Marycruz to call back. No further follow-up planned by this provider. ;

## 2020-01-08 NOTE — Assessment & Plan Note (Addendum)
3-day history, associated with intermittent headache and son with recent diarrheal illness now resolved.  Reassuringly appears well-hydrated with benign abdominal and neurologic exam.  Suspect is likely a viral GI illness, however given recent rise of atypical Covid delta variant with GI symptoms, recommended Covid testing.  No alarm s/sx (fever, hematochezia etc) to suggest bacterial source at this time.  Encouraged maintaining hydration and using Imodium as needed.

## 2020-01-09 NOTE — Progress Notes (Signed)
Chief Complaint:   OBESITY Rose Patterson is here to discuss her progress with her obesity treatment plan along with follow-up of her obesity related diagnoses. Rose Patterson is on the Category 2 Plan and states she is following her eating plan approximately 75% of the time. Rose Patterson states she is lifting weights and doing cardio for 45 minutes 2 times per week.  Today's visit was #: 5 Starting weight: 231 lbs Starting date: 11/22/2019 Today's weight: 220 lbs Today's date: 01/08/2020 Total lbs lost to date: 11 lbs Total lbs lost since last in-office visit: 19 lbs  Interim History: Rose Patterson was last seen on 12/25/2019.  She had done celebration eating a lot prior.  Over the last 2 weeks, she ate on plan 75% of the time.  For snack calories, she had Michel[employer] Ultra, peanuts.  No complaints or concerns with the meal plan.  She likes it overall, she says.  She says it is difficult to eat all foods during a day though.  She is not weighing her proteins.  Subjective:   1. Pre-diabetes Rose Patterson has a diagnosis of prediabetes based on her elevated HgA1c and was informed this puts her at greater risk of developing diabetes. She continues to work on diet and exercise to decrease her risk of diabetes. She denies nausea or hypoglycemia.  Lab Results  Component Value Date   HGBA1C 5.7 (H) 11/22/2019   Lab Results  Component Value Date   INSULIN 9.4 11/22/2019   2. Vitamin D deficiency Rose Patterson's Vitamin D level was 12.4 on 11/22/2019. She is currently taking prescription vitamin D 50,000 IU each week. She denies nausea, vomiting or muscle weakness.  No change in energy levels or fatigue yet.  Tolerating medication well.  3. Low HDL (under 40) HDL was low at 36 around a month ago.  Lab Results  Component Value Date   ALT 18 11/22/2019   AST 18 11/22/2019   ALKPHOS 94 11/22/2019   BILITOT 0.3 11/22/2019   Lab Results  Component Value Date   CHOL 111 11/22/2019   HDL 36 (L) 11/22/2019   LDLCALC 63 11/22/2019    TRIG 51 11/22/2019   CHOLHDL 3.1 11/22/2019   Assessment/Plan:   1. Pre-diabetes Rose Patterson will continue to work on weight loss, exercise, and decreasing simple carbohydrates to help decrease the risk of diabetes.  Decrease simple carbs.  Continue prudent nutritional plan as directed, weight loss.  Recheck in 3 months.  2. Vitamin D deficiency Low Vitamin D level contributes to fatigue and are associated with obesity, breast, and colon cancer. She agrees to continue to take prescription Vitamin D @50 ,000 IU every week and will follow-up for routine testing of Vitamin D, at least 2-3 times per year to avoid over-replacement.  Recheck levels in 3 months. - Vitamin D, Ergocalciferol, (DRISDOL) 1.25 MG (50000 UNIT) CAPS capsule; Take 1 capsule (50,000 Units total) by mouth every 7 (seven) days.  Dispense: 4 capsule; Refill: 0  3. Low HDL (under 40) Increase activity to 30 minutes 5 days per week or more.  Recheck FLP in 3 months.  Continue prudent nutritional plan, weight loss.  4. Class 3 severe obesity with serious comorbidity and body mass index (BMI) of 40.0 to 44.9 in adult, unspecified obesity type (HCC) Rose Patterson is currently in the action stage of change. As such, her goal is to continue with weight loss efforts. She has agreed to the Category 2 Plan.   Exercise goals: For substantial health benefits, adults should do at  least 150 minutes (2 hours and 30 minutes) a week of moderate-intensity, or 75 minutes (1 hour and 15 minutes) a week of vigorous-intensity aerobic physical activity, or an equivalent combination of moderate- and vigorous-intensity aerobic activity. Aerobic activity should be performed in episodes of at least 10 minutes, and preferably, it should be spread throughout the week.  Behavioral modification strategies: increasing lean protein intake and increasing water intake.  Rose Patterson has agreed to follow-up with our clinic in 2 weeks. She was informed of the importance of frequent  follow-up visits to maximize her success with intensive lifestyle modifications for her multiple health conditions.   Objective:   Blood pressure 137/83, pulse 86, temperature 97.9 F (36.6 C), height 5\' 2"  (1.575 m), weight 220 lb (99.8 kg), last menstrual period 12/28/2019, SpO2 100 %. Body mass index is 40.24 kg/m.  General: Cooperative, alert, well developed, in no acute distress. HEENT: Conjunctivae and lids unremarkable. Cardiovascular: Regular rhythm.  Lungs: Normal work of breathing. Neurologic: No focal deficits.   Lab Results  Component Value Date   CREATININE 0.67 11/22/2019   BUN 12 11/22/2019   NA 140 11/22/2019   K 4.0 11/22/2019   CL 105 11/22/2019   CO2 19 (L) 11/22/2019   Lab Results  Component Value Date   ALT 18 11/22/2019   AST 18 11/22/2019   ALKPHOS 94 11/22/2019   BILITOT 0.3 11/22/2019   Lab Results  Component Value Date   HGBA1C 5.7 (H) 11/22/2019   HGBA1C 5.8 (H) 08/22/2019   Lab Results  Component Value Date   INSULIN 9.4 11/22/2019   Lab Results  Component Value Date   TSH 1.460 11/22/2019   Lab Results  Component Value Date   CHOL 111 11/22/2019   HDL 36 (L) 11/22/2019   LDLCALC 63 11/22/2019   TRIG 51 11/22/2019   CHOLHDL 3.1 11/22/2019   Lab Results  Component Value Date   WBC 9.1 11/22/2019   HGB 12.0 11/22/2019   HCT 37.3 11/22/2019   MCV 85 11/22/2019   PLT 338 11/22/2019   Attestation Statements:   Reviewed by clinician on day of visit: allergies, medications, problem list, medical history, surgical history, family history, social history, and previous encounter notes.  Time spent on visit including pre-visit chart review and post-visit care and charting was 25+ minutes.   I, 11/24/2019, CMA, am acting as Insurance claims handler for Energy manager, DO.  I have reviewed the above documentation for accuracy and completeness, and I agree with the above. [employer], DO

## 2020-01-10 ENCOUNTER — Encounter: Payer: Self-pay | Admitting: Family Medicine

## 2020-01-11 NOTE — Telephone Encounter (Signed)
Patient returns call to nurse line to follow up on mychart message. Patient is requesting work note to be extended, as she is still not fully recovered.   To Dr. Carmon Sails, RN

## 2020-01-12 ENCOUNTER — Other Ambulatory Visit: Payer: Self-pay | Admitting: Family Medicine

## 2020-01-12 ENCOUNTER — Telehealth: Payer: Self-pay | Admitting: Family Medicine

## 2020-01-12 NOTE — Telephone Encounter (Signed)
Letter for Pt to return to work picked-up by [employer]

## 2020-01-25 ENCOUNTER — Encounter (INDEPENDENT_AMBULATORY_CARE_PROVIDER_SITE_OTHER): Payer: Self-pay

## 2020-01-29 ENCOUNTER — Ambulatory Visit (INDEPENDENT_AMBULATORY_CARE_PROVIDER_SITE_OTHER): Payer: Medicaid Other | Admitting: Family Medicine

## 2021-11-04 ENCOUNTER — Encounter: Payer: Self-pay | Admitting: *Deleted

## 2022-01-07 ENCOUNTER — Encounter (INDEPENDENT_AMBULATORY_CARE_PROVIDER_SITE_OTHER): Payer: Self-pay

## 2023-08-25 NOTE — Telephone Encounter (Signed)
error
